# Patient Record
Sex: Female | Born: 1970
Health system: Southern US, Community
[De-identification: ages and names within clinical notes are randomized; demographics above are authoritative.]

## PROBLEM LIST (undated history)

## (undated) DIAGNOSIS — R161 Splenomegaly, not elsewhere classified: Secondary | ICD-10-CM

## (undated) DIAGNOSIS — D696 Thrombocytopenia, unspecified: Secondary | ICD-10-CM

## (undated) DIAGNOSIS — K766 Portal hypertension: Secondary | ICD-10-CM

## (undated) DIAGNOSIS — J45909 Unspecified asthma, uncomplicated: Secondary | ICD-10-CM

## (undated) DIAGNOSIS — K7460 Unspecified cirrhosis of liver: Secondary | ICD-10-CM

## (undated) DIAGNOSIS — F411 Generalized anxiety disorder: Secondary | ICD-10-CM

## (undated) DIAGNOSIS — E119 Type 2 diabetes mellitus without complications: Secondary | ICD-10-CM

## (undated) DIAGNOSIS — R0600 Dyspnea, unspecified: Secondary | ICD-10-CM

## (undated) DIAGNOSIS — K589 Irritable bowel syndrome without diarrhea: Secondary | ICD-10-CM

## (undated) DIAGNOSIS — I10 Essential (primary) hypertension: Secondary | ICD-10-CM

## (undated) DIAGNOSIS — E785 Hyperlipidemia, unspecified: Secondary | ICD-10-CM

## (undated) DIAGNOSIS — K2950 Unspecified chronic gastritis without bleeding: Secondary | ICD-10-CM

## (undated) DIAGNOSIS — K219 Gastro-esophageal reflux disease without esophagitis: Secondary | ICD-10-CM

## (undated) DIAGNOSIS — M1990 Unspecified osteoarthritis, unspecified site: Secondary | ICD-10-CM

## (undated) DIAGNOSIS — G4730 Sleep apnea, unspecified: Secondary | ICD-10-CM

## (undated) DIAGNOSIS — E039 Hypothyroidism, unspecified: Secondary | ICD-10-CM

## (undated) DIAGNOSIS — E669 Obesity, unspecified: Secondary | ICD-10-CM

## (undated) HISTORY — DX: Essential (primary) hypertension: I10

## (undated) HISTORY — DX: Unspecified chronic gastritis without bleeding: K29.50

## (undated) HISTORY — DX: Obesity, unspecified: E66.9

## (undated) HISTORY — DX: Type 2 diabetes mellitus without complications: E11.9

## (undated) HISTORY — PX: TOTAL VAGINAL HYSTERECTOMY: SHX2548

## (undated) HISTORY — PX: TONSILLECTOMY: SUR1361

## (undated) HISTORY — DX: Thrombocytopenia, unspecified: D69.6

## (undated) HISTORY — DX: Unspecified osteoarthritis, unspecified site: M19.90

## (undated) HISTORY — DX: Irritable bowel syndrome, unspecified: K58.9

## (undated) HISTORY — DX: Generalized anxiety disorder: F41.1

## (undated) HISTORY — DX: Splenomegaly, not elsewhere classified: R16.1

## (undated) HISTORY — DX: Hyperlipidemia, unspecified: E78.5

## (undated) HISTORY — DX: Unspecified cirrhosis of liver: K74.60

## (undated) HISTORY — DX: Portal hypertension: K76.6

---

## 2016-01-14 DIAGNOSIS — G4733 Obstructive sleep apnea (adult) (pediatric): Secondary | ICD-10-CM | POA: Diagnosis not present

## 2016-01-15 DIAGNOSIS — G4733 Obstructive sleep apnea (adult) (pediatric): Secondary | ICD-10-CM | POA: Diagnosis not present

## 2016-01-21 DIAGNOSIS — G4733 Obstructive sleep apnea (adult) (pediatric): Secondary | ICD-10-CM | POA: Diagnosis not present

## 2016-02-14 DIAGNOSIS — G4733 Obstructive sleep apnea (adult) (pediatric): Secondary | ICD-10-CM | POA: Diagnosis not present

## 2016-02-28 DIAGNOSIS — J0100 Acute maxillary sinusitis, unspecified: Secondary | ICD-10-CM | POA: Diagnosis not present

## 2017-01-11 DIAGNOSIS — G4733 Obstructive sleep apnea (adult) (pediatric): Secondary | ICD-10-CM | POA: Diagnosis not present

## 2017-01-21 ENCOUNTER — Encounter: Payer: Self-pay | Admitting: Internal Medicine

## 2017-01-21 ENCOUNTER — Ambulatory Visit (INDEPENDENT_AMBULATORY_CARE_PROVIDER_SITE_OTHER): Payer: BLUE CROSS/BLUE SHIELD | Admitting: Internal Medicine

## 2017-01-21 VITALS — BP 112/80 | HR 77 | Ht 63.78 in | Wt 177.0 lb

## 2017-01-21 DIAGNOSIS — Z794 Long term (current) use of insulin: Secondary | ICD-10-CM | POA: Diagnosis not present

## 2017-01-21 DIAGNOSIS — E1169 Type 2 diabetes mellitus with other specified complication: Secondary | ICD-10-CM

## 2017-01-21 DIAGNOSIS — K7460 Unspecified cirrhosis of liver: Secondary | ICD-10-CM

## 2017-01-21 DIAGNOSIS — E034 Atrophy of thyroid (acquired): Secondary | ICD-10-CM | POA: Diagnosis not present

## 2017-01-21 DIAGNOSIS — K581 Irritable bowel syndrome with constipation: Secondary | ICD-10-CM

## 2017-01-21 DIAGNOSIS — K766 Portal hypertension: Secondary | ICD-10-CM | POA: Insufficient documentation

## 2017-01-21 DIAGNOSIS — E119 Type 2 diabetes mellitus without complications: Secondary | ICD-10-CM | POA: Diagnosis not present

## 2017-01-21 DIAGNOSIS — E785 Hyperlipidemia, unspecified: Secondary | ICD-10-CM

## 2017-01-21 DIAGNOSIS — K2950 Unspecified chronic gastritis without bleeding: Secondary | ICD-10-CM | POA: Insufficient documentation

## 2017-01-21 DIAGNOSIS — F411 Generalized anxiety disorder: Secondary | ICD-10-CM | POA: Insufficient documentation

## 2017-01-21 DIAGNOSIS — G4733 Obstructive sleep apnea (adult) (pediatric): Secondary | ICD-10-CM | POA: Diagnosis not present

## 2017-01-21 DIAGNOSIS — R161 Splenomegaly, not elsewhere classified: Secondary | ICD-10-CM | POA: Insufficient documentation

## 2017-01-21 DIAGNOSIS — Z9989 Dependence on other enabling machines and devices: Secondary | ICD-10-CM

## 2017-01-21 DIAGNOSIS — I10 Essential (primary) hypertension: Secondary | ICD-10-CM

## 2017-01-21 DIAGNOSIS — K589 Irritable bowel syndrome without diarrhea: Secondary | ICD-10-CM | POA: Insufficient documentation

## 2017-01-21 DIAGNOSIS — J3089 Other allergic rhinitis: Secondary | ICD-10-CM | POA: Diagnosis not present

## 2017-01-21 DIAGNOSIS — D696 Thrombocytopenia, unspecified: Secondary | ICD-10-CM | POA: Insufficient documentation

## 2017-01-21 DIAGNOSIS — T7840XA Allergy, unspecified, initial encounter: Secondary | ICD-10-CM | POA: Insufficient documentation

## 2017-01-21 DIAGNOSIS — M1990 Unspecified osteoarthritis, unspecified site: Secondary | ICD-10-CM | POA: Insufficient documentation

## 2017-01-21 DIAGNOSIS — E039 Hypothyroidism, unspecified: Secondary | ICD-10-CM | POA: Insufficient documentation

## 2017-01-21 DIAGNOSIS — F419 Anxiety disorder, unspecified: Secondary | ICD-10-CM

## 2017-01-21 DIAGNOSIS — E669 Obesity, unspecified: Secondary | ICD-10-CM | POA: Insufficient documentation

## 2017-01-21 MED ORDER — AZITHROMYCIN 250 MG PO TABS: ORAL_TABLET | ORAL | 0 refills | Status: DC

## 2017-01-21 MED ORDER — SERTRALINE HCL 50 MG PO TABS: 75.0000 mg | ORAL_TABLET | Freq: Every day | ORAL | 2 refills | Status: DC

## 2017-01-21 NOTE — Progress Notes (Signed)
Date:  01/21/2017   Name:  Amber Odonnell   DOB:  Dec 10, 1970   MRN:  [id number]   Chief Complaint: [place] (Moved from [place] in January- lives in [place].); Irritable Bowel Syndrome (Not able to eat alot. Feeling more fatigue- extreme contipation. Was taking Linzess but stopped taking due to gassyness. Bloated and feels more at top of stomach where pain is.  ); and Allergies (Nose was bleeding inside of nose when left house. Feels congested, ears feel like have fliud in them, and runny nose. Took sudafed today- thats why nose bled.) Diabetes  She presents for her follow-up diabetic visit. She has type 2 diabetes mellitus. Her disease course has been stable. Hypoglycemia symptoms include nervousness/anxiousness. Pertinent negatives for hypoglycemia include no dizziness or headaches. Associated symptoms include fatigue. Pertinent negatives for diabetes include no chest pain, no polydipsia and no polyuria. Symptoms are stable. Current diabetic treatment includes intensive insulin program (plus metformin and jardiance but stopped jardiance recently without much change in BS). She is compliant with treatment most of the time. She monitors blood glucose at home 1-2 x per day. Her breakfast blood glucose is taken between 6-7 am. Her breakfast blood glucose range is generally 180-200 mg/dl.  Thyroid Problem  Presents for follow-up visit. Symptoms include anxiety, constipation, fatigue, palpitations and weight gain. Patient reports no diaphoresis. The symptoms have been stable. Her past medical history is significant for hyperlipidemia.  Hyperlipidemia  This is a chronic problem. Pertinent negatives include no chest pain or shortness of breath. Current antihyperlipidemic treatment includes statins. The current treatment provides moderate (about to change to crestor from zocor) improvement of lipids.  Anxiety  Presents for follow-up visit. Symptoms include nervous/anxious behavior and  palpitations. Patient reports no chest pain, decreased concentration, dizziness, panic or shortness of breath. The quality of sleep is fair.   Compliance with medications: started sertraline in December.  Hypertension  This is a chronic problem. The problem is controlled. Associated symptoms include anxiety and palpitations. Pertinent negatives include no chest pain, headaches or shortness of breath. Past treatments include beta blockers. There are no compliance problems.  Identifiable causes of hypertension include a thyroid problem.  IBS - had colonoscopy in [place] in 2016. She has early satiety with bloating and pain. Linzess helped the constipation but caused gas and flatulence. She stopped LInzess.  She has a small hard stool daily if she takes no medications. OSA - on nightly CPAP.  Not sure if its helping much but she continues to use it. Allergies - multiple allergies by recent testing.  Has tried flonase, singulair and zyrtec without improvement.  Injections were recommended but then she moved. Cirrhosis - pt states this was diagnosed in the past few years.  Characterized as early stage, thought to be due to DM.  She is not sure she every had fluid and has not been on diuretics.  She has been on Xifaxan but is currently out. Review of Systems  Constitutional: Positive for fatigue and weight gain. Negative for chills, diaphoresis, fever and unexpected weight change.  HENT: Positive for congestion, ear pain, sinus pain and sinus pressure. Negative for hearing loss.   Respiratory: Negative for chest tightness, shortness of breath and wheezing.   Cardiovascular: Positive for palpitations. Negative for chest pain and leg swelling.  Gastrointestinal: Positive for abdominal distention and constipation. Negative for abdominal pain and blood in stool.  Endocrine: Negative for polydipsia and polyuria.  Genitourinary: Negative for dysuria.  Musculoskeletal: Negative for arthralgias.  Skin: Negative for  rash.  Neurological: Negative for dizziness and headaches.  Psychiatric/Behavioral: Negative for decreased concentration, dysphoric mood and sleep disturbance. The patient is nervous/anxious.     Patient Active Problem List   Diagnosis Date Noted  . Environmental and seasonal allergies 01/21/2017  . Type 2 diabetes mellitus without complication, with long-term current use of insulin ([place]) 01/21/2017  . Hypothyroidism due to acquired atrophy of thyroid 01/21/2017  . Hyperlipidemia associated with type 2 diabetes mellitus ([place]) 01/21/2017  . Irritable bowel syndrome with constipation 01/21/2017  . Anxiety disorder 01/21/2017    Prior to Admission medications   Medication Sig Start Date End Date Taking? Authorizing Provider  B Complex Vitamins (B-COMPLEX/B-12 SL) Place under the tongue every morning.   Yes Historical Provider, MD  diclofenac (VOLTAREN) 75 MG EC tablet Take 75 mg by mouth 2 (two) times daily.   Yes Historical Provider, MD  empagliflozin (JARDIANCE) 10 MG TABS tablet Take 10 mg by mouth daily.   Yes Historical Provider, MD  fluticasone (FLONASE) 50 MCG/ACT nasal spray Place 2 sprays into both nostrils daily.   Yes Historical Provider, MD  Insulin Aspart Prot & Aspart (NOVOLOG MIX 70/30 FLEXPEN [place]) Inject 26 Units into the skin 3 (three) times daily.   Yes Historical Provider, MD  Insulin Degludec (TRESIBA FLEXTOUCH [place]) Inject 80 Units into the skin daily.   Yes Historical Provider, MD  levothyroxine (SYNTHROID, LEVOTHROID) 50 MCG tablet Take 50 mcg by mouth daily before breakfast.   Yes Historical Provider, MD  linaclotide (LINZESS) 290 MCG CAPS capsule Take 290 mcg by mouth daily before breakfast.   Yes Historical Provider, MD  Loratadine 10 MG CAPS Take by mouth daily.   Yes Historical Provider, MD  losartan (COZAAR) 25 MG tablet Take 25 mg by mouth 2 (two) times daily.   Yes Historical Provider, MD  MEGARED OMEGA-3 KRILL OIL PO Take by mouth.   Yes Historical Provider, MD    metFORMIN (GLUCOPHAGE-XR) 500 MG 24 hr tablet Take 1,000 mg by mouth 2 (two) times daily.   Yes Historical Provider, MD  montelukast (SINGULAIR) 10 MG tablet Take 10 mg by mouth at bedtime.   Yes Historical Provider, MD  Multiple Vitamins-Minerals (ALIVE ONCE DAILY WOMENS 50+ PO) Take by mouth.   Yes Historical Provider, MD  nadolol (CORGARD) 20 MG tablet Take 20 mg by mouth daily.   Yes Historical Provider, MD  pantoprazole (PROTONIX) 40 MG tablet Take 40 mg by mouth daily.   Yes Historical Provider, MD  rifaximin (XIFAXAN) 200 MG tablet Take 200 mg by mouth 2 (two) times daily.   Yes Historical Provider, MD  rosuvastatin (CRESTOR) 10 MG tablet Take 10 mg by mouth daily.   Yes Historical Provider, MD  sertraline (ZOLOFT) 50 MG tablet Take 50 mg by mouth daily.   Yes Historical Provider, MD    Allergies  Allergen Reactions  . Hydrocodone-Homatropine Other (See Comments)    Thrush mouth    History reviewed. No pertinent surgical history.  Social History  Substance Use Topics  . Smoking status: Never Smoker  . Smokeless tobacco: Never Used  . Alcohol use No     Medication list has been reviewed and updated.   Physical Exam  Constitutional: She is oriented to person, place, and time. She appears well-developed. No distress.  HENT:  Head: Normocephalic and atraumatic.  Right Ear: Ear canal normal. Tympanic membrane is retracted. Tympanic membrane is not erythematous.  Left Ear: Ear canal normal. Tympanic membrane is  retracted. Tympanic membrane is not erythematous.  Nose: Right sinus exhibits maxillary sinus tenderness. Left sinus exhibits maxillary sinus tenderness.  Mouth/Throat: No posterior oropharyngeal erythema.  Neck: Normal range of motion. Neck supple. Carotid bruit is not present.  Cardiovascular: Normal rate, regular rhythm and normal heart sounds.   Pulmonary/Chest: Effort normal and breath sounds normal. No respiratory distress. She has no wheezes. She has no  rhonchi.  Abdominal: Soft. Normal appearance and bowel sounds are normal. She exhibits no distension. There is tenderness in the left lower quadrant. There is no rigidity, no guarding and no CVA tenderness.  Neurological: She is alert and oriented to person, place, and time. She has normal strength. No sensory deficit.  Skin: Skin is warm and dry. No rash noted.  Psychiatric: She has a normal mood and affect. Her speech is normal and behavior is normal. Thought content normal.  Nursing note and vitals reviewed.   BP 112/80 (BP Location: Left Arm, Patient Position: Sitting, Cuff Size: Normal)   Pulse 77   Ht 5' 3.78" (1.62 m)   Wt 177 lb (80.3 kg)   SpO2 96%   BMI 30.59 kg/m   Assessment and Plan: 1. Environmental and seasonal allergies With mild sinusitis Stop flonase, singulair and claritin due to lack of benefit - azithromycin (ZITHROMAX Z-PAK) 250 MG tablet; UAD  Dispense: 6 each; Refill: 0 - Ambulatory referral to ENT  2. Type 2 diabetes mellitus without complication, with long-term current use of insulin (HCC) Continue Tresiba, Novolog and metformin Hold Jardiance for now - CBC with Differential/Platelet - Comprehensive metabolic panel - Hemoglobin A1c  3. Hypothyroidism due to acquired atrophy of thyroid supplemented - TSH  4. Hyperlipidemia associated with type 2 diabetes mellitus ([place]) On zocor now - transition to crestor  5. Irritable bowel syndrome with constipation Remain off Linzess; consider Amitiza but will wait for GI recommendations - Ambulatory referral to Gastroenterology  6. Anxiety disorder, unspecified type Increase dose of Zoloft  7. OSA on CPAP Continue CPAP  8. Hepatic cirrhosis, unspecified hepatic cirrhosis type, unspecified whether ascites present ([place]) Remain off Xifaxan due to unclear benefit - Ambulatory referral to Gastroenterology  9. Essential hypertension Controlled but should be on ARB or ACE   Meds ordered this encounter   Medications  . azithromycin (ZITHROMAX Z-PAK) 250 MG tablet    Sig: UAD    Dispense:  6 each    Refill:  0  . sertraline (ZOLOFT) 50 MG tablet    Sig: Take 1.5 tablets (75 mg total) by mouth daily. 1.5 daily    Dispense:  45 tablet    Refill:  2   I spent 45 minutes with patient on this encounter.  More than 50% of time was spent in face to face education, medication management, counseling and care coordination.  Halina Maidens, MD [facility]  01/21/2017

## 2017-01-22 ENCOUNTER — Other Ambulatory Visit: Payer: Self-pay | Admitting: Internal Medicine

## 2017-01-22 LAB — CBC WITH DIFFERENTIAL/PLATELET
BASOS ABS: 0 10*3/uL (ref 0.0–0.2)
Basos: 1 %
EOS (ABSOLUTE): 0.2 10*3/uL (ref 0.0–0.4)
Eos: 3 %
Hematocrit: 38.7 % (ref 34.0–46.6)
Hemoglobin: 11.6 g/dL (ref 11.1–15.9)
IMMATURE GRANS (ABS): 0 10*3/uL (ref 0.0–0.1)
IMMATURE GRANULOCYTES: 0 %
Lymphocytes Absolute: 1.2 10*3/uL (ref 0.7–3.1)
Lymphs: 22 %
MCH: 26.7 pg (ref 26.6–33.0)
MCHC: 30 g/dL — ABNORMAL LOW (ref 31.5–35.7)
MCV: 89 fL (ref 79–97)
MONOCYTES: 9 %
Monocytes Absolute: 0.5 10*3/uL (ref 0.1–0.9)
Neutrophils Absolute: 3.5 10*3/uL (ref 1.4–7.0)
Neutrophils: 65 %
PLATELETS: 110 10*3/uL — AB (ref 150–379)
RBC: 4.35 x10E6/uL (ref 3.77–5.28)
RDW: 17.4 % — ABNORMAL HIGH (ref 12.3–15.4)
WBC: 5.4 10*3/uL (ref 3.4–10.8)

## 2017-01-22 LAB — COMPREHENSIVE METABOLIC PANEL
ALT: 55 IU/L — AB (ref 0–32)
AST: 78 IU/L — ABNORMAL HIGH (ref 0–40)
Albumin/Globulin Ratio: 1.4 (ref 1.2–2.2)
Albumin: 4.1 g/dL (ref 3.5–5.5)
Alkaline Phosphatase: 149 IU/L — ABNORMAL HIGH (ref 39–117)
BILIRUBIN TOTAL: 0.5 mg/dL (ref 0.0–1.2)
BUN/Creatinine Ratio: 10 (ref 9–23)
BUN: 7 mg/dL (ref 6–24)
CALCIUM: 9.2 mg/dL (ref 8.7–10.2)
CHLORIDE: 99 mmol/L (ref 96–106)
CO2: 21 mmol/L (ref 18–29)
Creatinine, Ser: 0.7 mg/dL (ref 0.57–1.00)
GFR, EST AFRICAN AMERICAN: 119 mL/min/{1.73_m2} (ref >59)
GFR, EST NON AFRICAN AMERICAN: 104 mL/min/{1.73_m2} (ref >59)
Globulin, Total: 2.9 g/dL (ref 1.5–4.5)
Glucose: 351 mg/dL — ABNORMAL HIGH (ref 65–99)
Potassium: 4.1 mmol/L (ref 3.5–5.2)
Sodium: 138 mmol/L (ref 134–144)
TOTAL PROTEIN: 7 g/dL (ref 6.0–8.5)

## 2017-01-22 LAB — HEMOGLOBIN A1C
Est. average glucose Bld gHb Est-mCnc: 235 mg/dL
Hgb A1c MFr Bld: 9.8 % — ABNORMAL HIGH (ref 4.8–5.6)

## 2017-01-22 LAB — TSH: TSH: 1.05 u[IU]/mL (ref 0.450–4.500)

## 2017-01-22 MED ORDER — EMPAGLIFLOZIN 25 MG PO TABS: 25.0000 mg | ORAL_TABLET | Freq: Every day | ORAL | 5 refills | Status: DC

## 2017-02-10 DIAGNOSIS — G4733 Obstructive sleep apnea (adult) (pediatric): Secondary | ICD-10-CM | POA: Diagnosis not present

## 2017-02-10 DIAGNOSIS — J301 Allergic rhinitis due to pollen: Secondary | ICD-10-CM | POA: Diagnosis not present

## 2017-02-16 ENCOUNTER — Encounter: Payer: Self-pay | Admitting: Gastroenterology

## 2017-02-16 ENCOUNTER — Other Ambulatory Visit: Payer: Self-pay

## 2017-02-16 ENCOUNTER — Ambulatory Visit (INDEPENDENT_AMBULATORY_CARE_PROVIDER_SITE_OTHER): Payer: BLUE CROSS/BLUE SHIELD | Admitting: Gastroenterology

## 2017-02-16 VITALS — BP 120/69 | HR 59 | Temp 99.3°F | Ht 63.0 in | Wt 179.0 lb

## 2017-02-16 DIAGNOSIS — K7460 Unspecified cirrhosis of liver: Secondary | ICD-10-CM | POA: Diagnosis not present

## 2017-02-16 DIAGNOSIS — K581 Irritable bowel syndrome with constipation: Secondary | ICD-10-CM

## 2017-02-16 MED ORDER — LACTULOSE 10 GM/15ML PO SOLN: 10.0000 g | Freq: Two times a day (BID) | ORAL | Status: DC

## 2017-02-16 NOTE — Progress Notes (Addendum)
Gastroenterology Consultation  Referring Provider:     Glean Hess, MD Primary Care Physician:  Glean Hess, MD Primary Gastroenterologist:  Dr. Allen Norris     Reason for Consultation:     Cirrhosis with irritable bowel syndrome        HPI:   Amber Odonnell is a 46 y.o. y/o female referred for consultation & management of Cirrhosis with irritable bowel syndrome by Dr. Army Melia, Jesse Sans, MD.  This patient comes in today with a history of cirrhosis after moving from [place]. The patient had an upper endoscopy there which showed her to have esophageal varices. The patient reports that she was also started on rifaximin at that time but was then taken off of it. The patient reports that she has been feeling weak and dizzy with increasing falls. There is no report of any black stools or bloody stools. The patient did not have any esophageal banding of her esophageal varices and was started on Nano all. The patient reports that she was tried on Linzess for her constipation but it made her gassy and she was unable to take it at work. The patient denies ever being started on lactulose. She reports that she has irritable bowel syndrome with constipation predominance and ports that she passes a lot of gas. The patient also thinks that she passes gas through her vagina.  Past Medical History:  Diagnosis Date  . Arthritis    of knee  . Chronic gastritis   . Cirrhosis of liver without ascites ([place])   . Diabetes mellitus without complication ([place])    type 2  . Generalized anxiety disorder   . Hyperlipidemia   . Hypertension   . Irritable bowel syndrome (IBS)    w/ constipation  . Obesity (BMI 30-39.9)   . Portal hypertension ([place])   . Splenomegaly   . Thrombocytopenia ([place])     Past Surgical History:  Procedure Laterality Date  . TONSILLECTOMY    . TOTAL VAGINAL HYSTERECTOMY     endometriosis    Prior to Admission medications   Medication Sig Start Date End Date Taking? Authorizing  Provider  BD PEN NEEDLE NANO U/F 32G X 4 MM MISC Inject 1 each into the skin 4 (four) times daily. 01/09/17  Yes [provider]  diclofenac (VOLTAREN) 75 MG EC tablet Take 75 mg by mouth 2 (two) times daily.   Yes [provider]  empagliflozin (JARDIANCE) 25 MG TABS tablet Take 25 mg by mouth daily. 01/22/17  Yes Glean Hess, MD  insulin aspart (NOVOLOG FLEXPEN) 100 UNIT/ML FlexPen Inject 26 Units into the skin 3 (three) times daily with meals.   Yes [provider]  Insulin Degludec (TRESIBA FLEXTOUCH [place]) Inject 80 Units into the skin daily.   Yes [provider]  levothyroxine (SYNTHROID, LEVOTHROID) 50 MCG tablet Take 50 mcg by mouth daily before breakfast.   Yes [provider]  losartan (COZAAR) 25 MG tablet Take 25 mg by mouth 2 (two) times daily.   Yes [provider]  MEGARED OMEGA-3 KRILL OIL PO Take by mouth.   Yes [provider]  metFORMIN (GLUCOPHAGE-XR) 500 MG 24 hr tablet Take 1,000 mg by mouth 2 (two) times daily.   Yes [provider]  Multiple Vitamins-Minerals (ALIVE ONCE DAILY WOMENS 50+ PO) Take by mouth.   Yes [provider]  nadolol (CORGARD) 20 MG tablet Take 20 mg by mouth daily.   Yes [provider]  pantoprazole (PROTONIX)  40 MG tablet Take 40 mg by mouth daily.   Yes [provider]  rifaximin (XIFAXAN) 200 MG tablet Take 200 mg by mouth 2 (two) times daily.   Yes [provider]  rosuvastatin (CRESTOR) 10 MG tablet Take 10 mg by mouth daily.   Yes [provider]  sertraline (ZOLOFT) 50 MG tablet Take 1.5 tablets (75 mg total) by mouth daily. 1.5 daily 01/21/17  Yes Glean Hess, MD  azithromycin [facility] Z-PAK) 250 MG tablet UAD Patient not taking: Reported on 02/16/2017 01/21/17   Glean Hess, MD  B Complex Vitamins (B-COMPLEX/B-12 SL) Place under the tongue every morning.    [provider]    Family History  Problem  Relation Age of Onset  . Breast cancer Mother   . Diabetes Mother   . Diabetes Father   . Hypertension Father   . Cancer Paternal Grandmother      Social History  Substance Use Topics  . Smoking status: Never Smoker  . Smokeless tobacco: Never Used  . Alcohol use No    Allergies as of 02/16/2017 - Review Complete 01/21/2017  Allergen Reaction Noted  . Hydrocodone Other (See Comments) 01/21/2017    Review of Systems:    All systems reviewed and negative except where noted in HPI.   Physical Exam:  BP 120/69   Pulse (!) 59   Temp 99.3 F (37.4 C) (Oral)   Ht 5\' 3"  (1.6 m)   Wt 179 lb (81.2 kg)   BMI 31.71 kg/m  No LMP recorded. Patient has had a hysterectomy. Psych:  Alert and cooperative. Normal mood and affect. General:   Alert,  Well-developed, well-nourished, pleasant and cooperative in NAD Head:  Normocephalic and atraumatic. Eyes:  Sclera clear, no icterus.   Conjunctiva pink. Ears:  Normal auditory acuity. Nose:  No deformity, discharge, or lesions. Mouth:  No deformity or lesions,oropharynx pink & moist. Neck:  Supple; no masses or thyromegaly. Lungs:  Respirations even and unlabored.  Clear throughout to auscultation.   No wheezes, crackles, or rhonchi. No acute distress. Heart:  Regular rate and rhythm; no murmurs, clicks, rubs, or gallops. Abdomen:  Normal bowel sounds.  No bruits.  Soft, non-tender and non-distended without masses, hepatosplenomegaly or hernias noted.  No guarding or rebound tenderness.  Negative Carnett sign.   Rectal:  Deferred.  Msk:  Symmetrical without gross deformities.  Good, equal movement & strength bilaterally. Pulses:  Normal pulses noted. Extremities:  No clubbing or edema.  No cyanosis. Neurologic:  Alert and oriented x3;  grossly normal neurologically. Skin:  Intact without significant lesions or rashes.  No jaundice. Lymph Nodes:  No significant cervical adenopathy. Psych:  Alert and cooperative. Normal mood and  affect.  Imaging Studies: No results found.  Assessment and Plan:   Amber Odonnell is a 46 y.o. y/o female comes in with a history of cirrhosis with a cause being fatty liver. The patient had a workup in [place] with the workup being negative except for the results of the alpha-1 antitrypsin which I do not have. The patient will have her blood sent off for alpha-1 antitrypsin and also for alpha-fetoprotein. She will have her liver enzymes rechecked and she will be set up for right upper quadrant ultrasound. The patient has also been told that she will need an upper endoscopy for eradication of her esophageal varices and she will be started on lactulose for her constipation which also may help with possible hepatic encephalopathy as the cause  of her confusion and frequent falls. The patient has been told to see Gynecology due to the passing of air from her vagina. The patient has been explained the plan and agrees with it.    [name], MD. [facility]   Note: This dictation was prepared with Dragon dictation along with smaller phrase technology. Any transcriptional errors that result from this process are unintentional.

## 2017-02-17 ENCOUNTER — Telehealth: Payer: Self-pay | Admitting: Gastroenterology

## 2017-02-17 ENCOUNTER — Telehealth: Payer: Self-pay

## 2017-02-17 ENCOUNTER — Other Ambulatory Visit: Payer: Self-pay

## 2017-02-17 DIAGNOSIS — K581 Irritable bowel syndrome with constipation: Secondary | ICD-10-CM

## 2017-02-17 DIAGNOSIS — I8500 Esophageal varices without bleeding: Secondary | ICD-10-CM

## 2017-02-17 LAB — HEPATIC FUNCTION PANEL
ALBUMIN: 4.1 g/dL (ref 3.5–5.5)
ALK PHOS: 147 IU/L — AB (ref 39–117)
ALT: 43 IU/L — ABNORMAL HIGH (ref 0–32)
AST: 50 IU/L — ABNORMAL HIGH (ref 0–40)
BILIRUBIN, DIRECT: 0.18 mg/dL (ref 0.00–0.40)
Bilirubin Total: 0.4 mg/dL (ref 0.0–1.2)
TOTAL PROTEIN: 6.8 g/dL (ref 6.0–8.5)

## 2017-02-17 LAB — AFP TUMOR MARKER: AFP-Tumor Marker: 4.5 ng/mL (ref 0.0–8.3)

## 2017-02-17 LAB — ALPHA-1-ANTITRYPSIN: A-1 Antitrypsin: 159 mg/dL (ref 90–200)

## 2017-02-17 NOTE — Telephone Encounter (Signed)
Pt has been informed ultrasound of abdomen is scheduled for [facility] 7:45 am Monday May 21st. Nothing to eat or drink after midnight on Sunday May 20th.

## 2017-02-17 NOTE — Telephone Encounter (Signed)
Pt has been informed her EGD has been scheduled for May 24th (Thursday) with Dr. Allen Norris at [facility].

## 2017-02-17 NOTE — Telephone Encounter (Signed)
02/17/17 Spoke w/ Lorriane Shire at [facility] and NO prior Josem Kaufmann is required for EGD w/ Banding [id number] / I85.00

## 2017-02-18 ENCOUNTER — Telehealth: Payer: Self-pay | Admitting: Gastroenterology

## 2017-02-18 ENCOUNTER — Other Ambulatory Visit: Payer: Self-pay

## 2017-02-18 MED ORDER — LACTULOSE 10 GM/15ML PO SOLN: 10.0000 g | Freq: Two times a day (BID) | ORAL | 0 refills | Status: DC | PRN

## 2017-02-18 NOTE — Telephone Encounter (Signed)
LVM  letting pt know rx has been sent to pharmacy for lactulose.

## 2017-02-18 NOTE — Telephone Encounter (Signed)
Dr. Allen Norris was going to call in a rX, for EGD,  for patient to [facility] on [address]. Patient stated [facility] never got it. She doesn't know the name of it.

## 2017-02-23 ENCOUNTER — Ambulatory Visit
Admission: RE | Admit: 2017-02-23 | Discharge: 2017-02-23 | Disposition: A | Discharge status: Home / Self Care | Payer: BLUE CROSS/BLUE SHIELD | Source: Ambulatory Visit | Attending: Gastroenterology | Admitting: Gastroenterology

## 2017-02-23 DIAGNOSIS — K7460 Unspecified cirrhosis of liver: Secondary | ICD-10-CM | POA: Diagnosis not present

## 2017-02-23 DIAGNOSIS — K581 Irritable bowel syndrome with constipation: Secondary | ICD-10-CM

## 2017-02-25 NOTE — Discharge Instructions (Signed)

## 2017-02-26 ENCOUNTER — Ambulatory Visit: Payer: BLUE CROSS/BLUE SHIELD | Admitting: Anesthesiology

## 2017-02-26 ENCOUNTER — Encounter
Admission: RE | Disposition: A | Discharge status: Home / Self Care | Payer: Self-pay | Source: Ambulatory Visit | Attending: Gastroenterology

## 2017-02-26 ENCOUNTER — Telehealth: Payer: Self-pay

## 2017-02-26 ENCOUNTER — Ambulatory Visit
Admission: RE | Admit: 2017-02-26 | Discharge: 2017-02-26 | Disposition: A | Discharge status: Home / Self Care | Payer: BLUE CROSS/BLUE SHIELD | Source: Ambulatory Visit | Attending: Gastroenterology | Admitting: Gastroenterology

## 2017-02-26 DIAGNOSIS — Z6830 Body mass index (BMI) 30.0-30.9, adult: Secondary | ICD-10-CM | POA: Insufficient documentation

## 2017-02-26 DIAGNOSIS — Z794 Long term (current) use of insulin: Secondary | ICD-10-CM | POA: Insufficient documentation

## 2017-02-26 DIAGNOSIS — F411 Generalized anxiety disorder: Secondary | ICD-10-CM | POA: Diagnosis not present

## 2017-02-26 DIAGNOSIS — K635 Polyp of colon: Secondary | ICD-10-CM

## 2017-02-26 DIAGNOSIS — Z885 Allergy status to narcotic agent status: Secondary | ICD-10-CM | POA: Diagnosis not present

## 2017-02-26 DIAGNOSIS — Z79899 Other long term (current) drug therapy: Secondary | ICD-10-CM | POA: Diagnosis not present

## 2017-02-26 DIAGNOSIS — K580 Irritable bowel syndrome with diarrhea: Secondary | ICD-10-CM | POA: Diagnosis not present

## 2017-02-26 DIAGNOSIS — K2970 Gastritis, unspecified, without bleeding: Secondary | ICD-10-CM | POA: Diagnosis not present

## 2017-02-26 DIAGNOSIS — K7460 Unspecified cirrhosis of liver: Secondary | ICD-10-CM | POA: Diagnosis not present

## 2017-02-26 DIAGNOSIS — K2950 Unspecified chronic gastritis without bleeding: Secondary | ICD-10-CM | POA: Diagnosis not present

## 2017-02-26 DIAGNOSIS — E669 Obesity, unspecified: Secondary | ICD-10-CM | POA: Insufficient documentation

## 2017-02-26 DIAGNOSIS — E119 Type 2 diabetes mellitus without complications: Secondary | ICD-10-CM | POA: Diagnosis not present

## 2017-02-26 DIAGNOSIS — K219 Gastro-esophageal reflux disease without esophagitis: Secondary | ICD-10-CM | POA: Diagnosis not present

## 2017-02-26 DIAGNOSIS — E039 Hypothyroidism, unspecified: Secondary | ICD-10-CM | POA: Diagnosis not present

## 2017-02-26 DIAGNOSIS — I8510 Secondary esophageal varices without bleeding: Secondary | ICD-10-CM | POA: Insufficient documentation

## 2017-02-26 DIAGNOSIS — E785 Hyperlipidemia, unspecified: Secondary | ICD-10-CM | POA: Diagnosis not present

## 2017-02-26 DIAGNOSIS — I10 Essential (primary) hypertension: Secondary | ICD-10-CM | POA: Diagnosis not present

## 2017-02-26 DIAGNOSIS — K766 Portal hypertension: Secondary | ICD-10-CM | POA: Diagnosis not present

## 2017-02-26 DIAGNOSIS — R195 Other fecal abnormalities: Secondary | ICD-10-CM

## 2017-02-26 DIAGNOSIS — K2960 Other gastritis without bleeding: Secondary | ICD-10-CM | POA: Diagnosis not present

## 2017-02-26 DIAGNOSIS — Z791 Long term (current) use of non-steroidal anti-inflammatories (NSAID): Secondary | ICD-10-CM | POA: Diagnosis not present

## 2017-02-26 DIAGNOSIS — D125 Benign neoplasm of sigmoid colon: Secondary | ICD-10-CM

## 2017-02-26 HISTORY — DX: Sleep apnea, unspecified: G47.30

## 2017-02-26 HISTORY — DX: Gastro-esophageal reflux disease without esophagitis: K21.9

## 2017-02-26 HISTORY — DX: Unspecified asthma, uncomplicated: J45.909

## 2017-02-26 HISTORY — DX: Dyspnea, unspecified: R06.00

## 2017-02-26 HISTORY — PX: ESOPHAGOGASTRODUODENOSCOPY (EGD) WITH PROPOFOL: SHX5813

## 2017-02-26 HISTORY — DX: Hypothyroidism, unspecified: E03.9

## 2017-02-26 LAB — GLUCOSE, CAPILLARY
GLUCOSE-CAPILLARY: 137 mg/dL — AB (ref 65–99)
Glucose-Capillary: 167 mg/dL — ABNORMAL HIGH (ref 65–99)

## 2017-02-26 SURGERY — ESOPHAGOGASTRODUODENOSCOPY (EGD) WITH PROPOFOL
Anesthesia: Monitor Anesthesia Care | Wound class: Clean Contaminated

## 2017-02-26 MED ORDER — LACTATED RINGERS IV SOLN
10.0000 mL/h | INTRAVENOUS | Status: DC
  Administered 2017-02-26: 10 mL/h via INTRAVENOUS

## 2017-02-26 MED ORDER — LIDOCAINE HCL (CARDIAC) 20 MG/ML IV SOLN
INTRAVENOUS | Status: DC | PRN
  Administered 2017-02-26: 50 mg via INTRAVENOUS

## 2017-02-26 MED ORDER — GLYCOPYRROLATE 0.2 MG/ML IJ SOLN
INTRAMUSCULAR | Status: DC | PRN
  Administered 2017-02-26: 0.2 mg via INTRAVENOUS

## 2017-02-26 MED ORDER — FENTANYL CITRATE (PF) 100 MCG/2ML IJ SOLN
50.0000 ug | Freq: Once | INTRAMUSCULAR | Status: AC
  Administered 2017-02-26: 50 ug via INTRAVENOUS

## 2017-02-26 MED ORDER — OXYCODONE HCL 5 MG/5ML PO SOLN
5.0000 mg | Freq: Once | ORAL | Status: AC
  Administered 2017-02-26: 5 mg via ORAL

## 2017-02-26 MED ORDER — PROPOFOL 10 MG/ML IV BOLUS
INTRAVENOUS | Status: DC | PRN
  Administered 2017-02-26 (×3): 50 mg via INTRAVENOUS
  Administered 2017-02-26: 100 mg via INTRAVENOUS
  Administered 2017-02-26: 50 mg via INTRAVENOUS

## 2017-02-26 MED ORDER — SIMETHICONE 40 MG/0.6ML PO SUSP
ORAL | Status: DC | PRN
  Administered 2017-02-26: 12:00:00

## 2017-02-26 SURGICAL SUPPLY — 33 items
BALLN DILATOR 10-12 8 (BALLOONS)
BALLN DILATOR 12-15 8 (BALLOONS)
BALLN DILATOR 15-18 8 (BALLOONS)
BALLN DILATOR CRE 0-12 8 (BALLOONS)
BALLN DILATOR ESOPH 8 10 CRE (MISCELLANEOUS) IMPLANT
BALLOON DILATOR 12-15 8 (BALLOONS) IMPLANT
BALLOON DILATOR 15-18 8 (BALLOONS) IMPLANT
BALLOON DILATOR CRE 0-12 8 (BALLOONS) IMPLANT
BLOCK BITE 60FR ADLT L/F GRN (MISCELLANEOUS) ×2 IMPLANT
CANISTER SUCT 1200ML W/VALVE (MISCELLANEOUS) ×2 IMPLANT
CLIP HMST 235XBRD CATH ROT (MISCELLANEOUS) IMPLANT
CLIP RESOLUTION 360 11X235 (MISCELLANEOUS)
FCP ESCP3.2XJMB 240X2.8X (MISCELLANEOUS)
FORCEPS BIOP RAD 4 LRG CAP 4 (CUTTING FORCEPS) IMPLANT
FORCEPS BIOP RJ4 240 W/NDL (MISCELLANEOUS)
FORCEPS ESCP3.2XJMB 240X2.8X (MISCELLANEOUS) IMPLANT
GOWN CVR UNV OPN BCK APRN NK (MISCELLANEOUS) ×2 IMPLANT
GOWN ISOL THUMB LOOP REG UNIV (MISCELLANEOUS) ×2
INJECTOR VARIJECT VIN23 (MISCELLANEOUS) IMPLANT
KIT DEFENDO VALVE AND CONN (KITS) IMPLANT
KIT ENDO PROCEDURE OLY (KITS) ×2 IMPLANT
LIGATOR MULTIBAND 6SHOOTER MBL (MISCELLANEOUS) ×2 IMPLANT
MARKER SPOT ENDO TATTOO 5ML (MISCELLANEOUS) IMPLANT
PAD GROUND ADULT SPLIT (MISCELLANEOUS) IMPLANT
RETRIEVER NET PLAT FOOD (MISCELLANEOUS) IMPLANT
SNARE SHORT THROW 13M SML OVAL (MISCELLANEOUS) IMPLANT
SNARE SHORT THROW 30M LRG OVAL (MISCELLANEOUS) IMPLANT
SPOT EX ENDOSCOPIC TATTOO (MISCELLANEOUS)
SYR INFLATION 60ML (SYRINGE) IMPLANT
TRAP ETRAP POLY (MISCELLANEOUS) IMPLANT
VARIJECT INJECTOR VIN23 (MISCELLANEOUS)
WATER STERILE IRR 250ML POUR (IV SOLUTION) ×2 IMPLANT
WIRE CRE 18-20MM 8CM F G (MISCELLANEOUS) IMPLANT

## 2017-02-26 NOTE — Anesthesia Preprocedure Evaluation (Addendum)
Anesthesia Evaluation  Patient identified by MRN, date of birth, ID band Patient awake    Reviewed: Allergy & Precautions, H&P   Airway Mallampati: II  TM Distance: >3 FB     Dental  (+) Teeth Intact   Pulmonary asthma , sleep apnea ,    breath sounds clear to auscultation       Cardiovascular hypertension,  Rhythm:regular Rate:Normal  hyperlipidemia   Neuro/Psych PSYCHIATRIC DISORDERS (generalized anxiety disorder)    GI/Hepatic GERD  Controlled,(+) Cirrhosis  (nonalcoholic)  Esophageal Varices    , IBS Chronic gastritis Portal hypertension    Endo/Other  diabetes, Type 2Hypothyroidism BMI 32   Renal/GU      Musculoskeletal   Abdominal   Peds  Hematology  (+) Blood dyscrasia (thrombocytopenia - platelets 110k), ,   Anesthesia Other Findings   Reproductive/Obstetrics                            Anesthesia Physical Anesthesia Plan  ASA: III  Anesthesia Plan: MAC   Post-op Pain Management:    Induction:   Airway Management Planned:   Additional Equipment:   Intra-op Plan:   Post-operative Plan:   Informed Consent: I have reviewed the patients History and Physical, chart, labs and discussed the procedure including the risks, benefits and alternatives for the proposed anesthesia with the patient or authorized representative who has indicated his/her understanding and acceptance.   Dental Advisory Given  Plan Discussed with: CRNA  Anesthesia Plan Comments:         Anesthesia Quick Evaluation

## 2017-02-26 NOTE — Op Note (Signed)
[facility] Gastroenterology Patient Name: Amber Odonnell Procedure Date: 02/26/2017 11:14 AM MRN: [id number] Account #: [id number] Date of Birth: 20-Apr-1971 Admit Type: Outpatient Age: 46 Room: [facility] OR ROOM 01 Gender: Female Note Status: Finalized Procedure:            Upper GI endoscopy Indications:          Cirrhosis rule out esophageal varices Providers:            [name] MD, MD Referring MD:         Halina Maidens, MD (Referring MD) Medicines:            Propofol per Anesthesia Complications:        No immediate complications. Procedure:            Pre-Anesthesia Assessment:                       - Prior to the procedure, a History and Physical was                        performed, and patient medications and allergies were                        reviewed. The patient's tolerance of previous                        anesthesia was also reviewed. The risks and benefits of                        the procedure and the sedation options and risks were                        discussed with the patient. All questions were                        answered, and informed consent was obtained. Prior                        Anticoagulants: The patient has taken no previous                        anticoagulant or antiplatelet agents. ASA Grade                        Assessment: II - A patient with mild systemic disease.                        After reviewing the risks and benefits, the patient was                        deemed in satisfactory condition to undergo the                        procedure.                       After obtaining informed consent, the endoscope was                        passed under direct vision. Throughout the procedure,  the patient's blood pressure, pulse, and oxygen                        saturations were monitored continuously. The Olympus                        190 Endoscope [id number]) was introduced through the                       mouth, and advanced to the second part of duodenum. The                        upper GI endoscopy was accomplished without difficulty.                        The patient tolerated the procedure well. Findings:      Grade III varices were found in the lower third of the esophagus. Four       bands were successfully placed with incomplete eradication of varices. A       slow ooze remained at the end of the procedure.      Localized moderate inflammation characterized by erythema was found in       the gastric antrum.      The examined duodenum was normal. Impression:           - Grade III esophageal varices. Incompletely                        eradicated. Banded.                       - Gastritis.                       - Normal examined duodenum.                       - No specimens collected. Recommendation:       - Discharge patient to home.                       - Full liquid diet for 2 days.                       - Continue present medications.                       - Repeat upper endoscopy in 6 weeks for retreatment. Procedure Code(s):    --- Professional ---                       [phone], Esophagogastroduodenoscopy, flexible, transoral;                        with band ligation of esophageal/gastric varices Diagnosis Code(s):    --- Professional ---                       K74.60, Unspecified cirrhosis of liver                       K29.70, Gastritis, unspecified, without bleeding  I85.10, Secondary esophageal varices without bleeding CPT copyright 2016 American Medical Association. All rights reserved. The codes documented in this report are preliminary and upon coder review may  be revised to meet current compliance requirements. [name] MD, MD 02/26/2017 11:33:34 AM This report has been signed electronically. Number of Addenda: 0 Note Initiated On: 02/26/2017 11:14 AM      [facility]

## 2017-02-26 NOTE — Telephone Encounter (Signed)
Lab results were discussed with pt today by Dr. Allen Norris at procedure appt.

## 2017-02-26 NOTE — Telephone Encounter (Signed)
-----   Message from [name], MD sent at 02/17/2017  1:54 PM EDT ----- Let the patient know that her liver enzymes are still elevated but her blood test for liver cancer was negative.

## 2017-02-26 NOTE — Anesthesia Procedure Notes (Signed)
Procedure Name: MAC Performed by: Raad Clayson Pre-anesthesia Checklist: Patient identified, Emergency Drugs available, Suction available, Timeout performed and Patient being monitored Patient Re-evaluated:Patient Re-evaluated prior to inductionOxygen Delivery Method: Nasal cannula Placement Confirmation: positive ETCO2     

## 2017-02-26 NOTE — Telephone Encounter (Signed)
-----   Message from [name], MD sent at 02/23/2017  6:24 PM EDT ----- Let the patient know the U/S did not show any masses.

## 2017-02-26 NOTE — Telephone Encounter (Signed)
Pt notified of US results.  

## 2017-02-26 NOTE — Anesthesia Postprocedure Evaluation (Signed)
Anesthesia Post Note  Patient: Amber Odonnell  Procedure(s) Performed: Procedure(s) (LRB): ESOPHAGOGASTRODUODENOSCOPY (EGD) WITH Banding (N/A)  Patient location during evaluation: PACU Anesthesia Type: MAC Level of consciousness: awake and alert Pain management: pain level controlled Vital Signs Assessment: post-procedure vital signs reviewed and stable Respiratory status: spontaneous breathing, nonlabored ventilation and respiratory function stable Cardiovascular status: stable Postop Assessment: no signs of nausea or vomiting Anesthetic complications: no    Veda Canning, MD

## 2017-02-26 NOTE — H&P (Signed)
Amber Lame, MD [facility] [address] [place], [place] [address] [phone] Fax : [phone]  Primary Care Physician:  Glean Hess, MD Primary Gastroenterologist:  Dr. Allen Norris  Pre-Procedure History & Physical: HPI:  Amber Odonnell is a 46 y.o. female is here for an endoscopy.   Past Medical History:  Diagnosis Date  . Arthritis    of knee  . Asthma   . Chronic gastritis   . Cirrhosis of liver without ascites ([place])   . Diabetes mellitus without complication ([place])    type 2  . Dyspnea   . Generalized anxiety disorder   . GERD (gastroesophageal reflux disease)   . Hyperlipidemia   . Hypertension   . Hypothyroidism   . Irritable bowel syndrome (IBS)    w/ constipation  . Obesity (BMI 30-39.9)   . Portal hypertension ([place])   . Sleep apnea   . Splenomegaly   . Thrombocytopenia ([place])     Past Surgical History:  Procedure Laterality Date  . TONSILLECTOMY    . TOTAL VAGINAL HYSTERECTOMY     endometriosis    Prior to Admission medications   Medication Sig Start Date End Date Taking? Authorizing Provider  B Complex Vitamins (B-COMPLEX/B-12 SL) Place under the tongue every morning.   Yes [provider]  BD PEN NEEDLE NANO U/F 32G X 4 MM MISC Inject 1 each into the skin 4 (four) times daily. 01/09/17  Yes [provider]  diclofenac (VOLTAREN) 75 MG EC tablet Take 75 mg by mouth 2 (two) times daily.   Yes [provider]  empagliflozin (JARDIANCE) 25 MG TABS tablet Take 25 mg by mouth daily. 01/22/17  Yes Glean Hess, MD  insulin aspart (NOVOLOG FLEXPEN) 100 UNIT/ML FlexPen Inject 26 Units into the skin 3 (three) times daily with meals.   Yes [provider]  lactulose (CHRONULAC) 10 GM/15ML solution Take 15 mLs (10 g total) by mouth 2 (two) times daily as needed for mild constipation. 02/18/17  Yes Amber Lame, MD  levothyroxine (SYNTHROID, LEVOTHROID) 50 MCG tablet Take 50 mcg by mouth daily before breakfast.   Yes  [provider]  losartan (COZAAR) 25 MG tablet Take 25 mg by mouth 2 (two) times daily.   Yes [provider]  MEGARED OMEGA-3 KRILL OIL PO Take by mouth.   Yes [provider]  metFORMIN (GLUCOPHAGE-XR) 500 MG 24 hr tablet Take 1,000 mg by mouth 2 (two) times daily.   Yes [provider]  Multiple Vitamins-Minerals (ALIVE ONCE DAILY WOMENS 50+ PO) Take by mouth.   Yes [provider]  nadolol (CORGARD) 20 MG tablet Take 20 mg by mouth daily.   Yes [provider]  pantoprazole (PROTONIX) 40 MG tablet Take 40 mg by mouth daily.   Yes [provider]  rifaximin (XIFAXAN) 200 MG tablet Take 200 mg by mouth 2 (two) times daily.   Yes [provider]  rosuvastatin (CRESTOR) 10 MG tablet Take 10 mg by mouth daily.   Yes [provider]  sertraline (ZOLOFT) 50 MG tablet Take 1.5 tablets (75 mg total) by mouth daily. 1.5 daily 01/21/17  Yes Glean Hess, MD  azithromycin [facility] Z-PAK) 250 MG tablet UAD Patient not taking: Reported on 02/16/2017 01/21/17   Glean Hess, MD  Insulin Degludec (TRESIBA FLEXTOUCH [place]) Inject 80 Units into the skin daily.    [provider]    Allergies as of 02/17/2017 - Review Complete 02/17/2017  Allergen Reaction Noted  .  Hydrocodone Other (See Comments) 01/21/2017    Family History  Problem Relation Age of Onset  . Breast cancer Mother   . Diabetes Mother   . Diabetes Father   . Hypertension Father   . Cancer Paternal Grandmother     Social History   Social History  . Marital status: Single    Spouse name: N/A  . Number of children: N/A  . Years of education: N/A   Occupational History  . Not on file.   Social History Main Topics  . Smoking status: Never Smoker  . Smokeless tobacco: Never Used  . Alcohol use No  . Drug use: No  . Sexual activity: No   Other Topics Concern  . Not on file   Social History Narrative  . No narrative on file     Review of Systems: See HPI, otherwise negative ROS  Physical Exam: BP 117/70   Pulse 72   Temp 97.9 F (36.6 C) (Temporal)   Resp 16   Ht 5\' 3"  (1.6 m)   Wt 174 lb (78.9 kg)   SpO2 93%   BMI 30.82 kg/m  General:   Alert,  pleasant and cooperative in NAD Head:  Normocephalic and atraumatic. Neck:  Supple; no masses or thyromegaly. Lungs:  Clear throughout to auscultation.    Heart:  Regular rate and rhythm. Abdomen:  Soft, nontender and nondistended. Normal bowel sounds, without guarding, and without rebound.   Neurologic:  Alert and  oriented x4;  grossly normal neurologically.  Impression/Plan: Amber Odonnell is here for an endoscopy to be performed for cirrhosis   Risks, benefits, limitations, and alternatives regarding  endoscopy have been reviewed with the patient.  Questions have been answered.  All parties agreeable.   Amber Lame, MD  02/26/2017, 10:48 AM

## 2017-02-26 NOTE — Transfer of Care (Signed)
Immediate Anesthesia Transfer of Care Note  Patient: Amber Odonnell  Procedure(s) Performed: Procedure(s): ESOPHAGOGASTRODUODENOSCOPY (EGD) WITH Banding (N/A)  Patient Location: PACU  Anesthesia Type: MAC  Level of Consciousness: awake, alert  and patient cooperative  Airway and Oxygen Therapy: Patient Spontanous Breathing and Patient connected to supplemental oxygen  Post-op Assessment: Post-op Vital signs reviewed, Patient's Cardiovascular Status Stable, Respiratory Function Stable, Patent Airway and No signs of Nausea or vomiting  Post-op Vital Signs: Reviewed and stable  Complications: No apparent anesthesia complications

## 2017-03-03 ENCOUNTER — Encounter: Payer: Self-pay | Admitting: Gastroenterology

## 2017-03-11 ENCOUNTER — Encounter: Payer: Self-pay | Admitting: Internal Medicine

## 2017-03-11 DIAGNOSIS — I8500 Esophageal varices without bleeding: Secondary | ICD-10-CM | POA: Insufficient documentation

## 2017-03-13 ENCOUNTER — Encounter: Payer: Self-pay | Admitting: Internal Medicine

## 2017-03-13 ENCOUNTER — Ambulatory Visit (INDEPENDENT_AMBULATORY_CARE_PROVIDER_SITE_OTHER): Payer: BLUE CROSS/BLUE SHIELD | Admitting: Internal Medicine

## 2017-03-13 VITALS — BP 120/88 | HR 82 | Ht 63.0 in | Wt 177.0 lb

## 2017-03-13 DIAGNOSIS — I10 Essential (primary) hypertension: Secondary | ICD-10-CM | POA: Diagnosis not present

## 2017-03-13 DIAGNOSIS — E034 Atrophy of thyroid (acquired): Secondary | ICD-10-CM

## 2017-03-13 DIAGNOSIS — I8500 Esophageal varices without bleeding: Secondary | ICD-10-CM

## 2017-03-13 DIAGNOSIS — H578 Other specified disorders of eye and adnexa: Secondary | ICD-10-CM

## 2017-03-13 DIAGNOSIS — N898 Other specified noninflammatory disorders of vagina: Secondary | ICD-10-CM | POA: Diagnosis not present

## 2017-03-13 DIAGNOSIS — E119 Type 2 diabetes mellitus without complications: Secondary | ICD-10-CM

## 2017-03-13 DIAGNOSIS — Z794 Long term (current) use of insulin: Secondary | ICD-10-CM

## 2017-03-13 DIAGNOSIS — H5789 Other specified disorders of eye and adnexa: Secondary | ICD-10-CM

## 2017-03-13 NOTE — Progress Notes (Signed)
Date:  03/13/2017   Name:  Amber Odonnell   DOB:  01-25-1971   MRN:  [id number]   Chief Complaint: Diabetes and Hypothyroidism Diabetes  She presents for her follow-up diabetic visit. She has type 2 diabetes mellitus. Pertinent negatives for hypoglycemia include no headaches or tremors. Pertinent negatives for diabetes include no chest pain, no fatigue, no polydipsia and no polyuria. Current diabetic treatment includes insulin injections and oral agent (dual therapy).  Thyroid Problem  Presents for follow-up visit. Patient reports no fatigue, palpitations or tremors. The symptoms have been stable.  Hypertension  This is a chronic problem. The problem is controlled. Pertinent negatives include no chest pain, headaches, palpitations or shortness of breath. Identifiable causes of hypertension include a thyroid problem.  Cirrhosis - seen recently by GI - had EGD and banding of esophageal varices.  She denies bleeding issues. She has repeat study in 6 weeks and likely more banding.  She continues to have sx of IBS.  Has not had a colonoscopy in a while.  She will discuss with Dr. Allen Norris. Vaginal flatus -she states that she passes flatus from her vagina.  She has some itching from Jardiance but no vaginal discharge or fecal material. Eye discharge - has clear water from her left eye continually.  Has tried many allergy medications, nasal sprays, etc with no improvement.  The ENT wanted her to take allergy injections.  Lab Results  Component Value Date   HGBA1C 9.8 (H) 01/21/2017   Lab Results  Component Value Date   TSH 1.050 01/21/2017   No results found for: CHOL, HDL, LDLCALC, LDLDIRECT, TRIG, CHOLHDL    Review of Systems  Constitutional: Negative for appetite change, fatigue, fever and unexpected weight change.  HENT: Negative for tinnitus and trouble swallowing.   Eyes: Negative for visual disturbance.  Respiratory: Negative for cough, chest tightness and shortness of breath.     Cardiovascular: Negative for chest pain, palpitations and leg swelling.  Gastrointestinal: Negative for abdominal pain.  Endocrine: Negative for polydipsia and polyuria.  Genitourinary: Negative for decreased urine volume, dysuria, hematuria and vaginal bleeding.       Vaginal itching and sensation of passing air from her vagina  Musculoskeletal: Negative for arthralgias.  Neurological: Negative for tremors, numbness and headaches.  Psychiatric/Behavioral: Negative for dysphoric mood.    Patient Active Problem List   Diagnosis Date Noted  . Esophageal varices determined by endoscopy ([place]) 03/11/2017  . Abnormal feces   . Polyp of sigmoid colon   . Gastritis without bleeding   . Environmental and seasonal allergies 01/21/2017  . Type 2 diabetes mellitus without complication, with long-term current use of insulin ([place]) 01/21/2017  . Hypothyroidism due to acquired atrophy of thyroid 01/21/2017  . Hyperlipidemia associated with type 2 diabetes mellitus ([place]) 01/21/2017  . Irritable bowel syndrome with constipation 01/21/2017  . Obstructive sleep apnea syndrome 01/21/2017  . Thrombocytopenia ([place])   . Splenomegaly   . Portal hypertension ([place])   . Obesity (BMI 30-39.9)   . Essential hypertension   . Hyperlipidemia   . Generalized anxiety disorder   . Hepatic cirrhosis ([place])   . Chronic gastritis   . Arthritis     Prior to Admission medications   Medication Sig Start Date End Date Taking? Authorizing Provider       Glean Hess, MD  B Complex Vitamins (B-COMPLEX/B-12 SL) Place under the tongue every morning.    [provider]  BD PEN NEEDLE NANO U/F 32G X  4 MM MISC Inject 1 each into the skin 4 (four) times daily. 01/09/17   [provider]  diclofenac (VOLTAREN) 75 MG EC tablet Take 75 mg by mouth 2 (two) times daily.    [provider]  empagliflozin (JARDIANCE) 25 MG TABS tablet Take 25 mg by mouth daily. 01/22/17   Glean Hess, MD  insulin  aspart (NOVOLOG FLEXPEN) 100 UNIT/ML FlexPen Inject 26 Units into the skin 3 (three) times daily with meals.    [provider]  Insulin Degludec (TRESIBA FLEXTOUCH [place]) Inject 80 Units into the skin daily.    [provider]  lactulose (CHRONULAC) 10 GM/15ML solution Take 15 mLs (10 g total) by mouth 2 (two) times daily as needed for mild constipation. 02/18/17   [name], MD  levothyroxine (SYNTHROID, LEVOTHROID) 50 MCG tablet Take 50 mcg by mouth daily before breakfast.    [provider]  losartan (COZAAR) 25 MG tablet Take 25 mg by mouth 2 (two) times daily.    [provider]  MEGARED OMEGA-3 KRILL OIL PO Take by mouth.    [provider]  metFORMIN (GLUCOPHAGE-XR) 500 MG 24 hr tablet Take 1,000 mg by mouth 2 (two) times daily.    [provider]  Multiple Vitamins-Minerals (ALIVE ONCE DAILY WOMENS 50+ PO) Take by mouth.    [provider]  nadolol (CORGARD) 20 MG tablet Take 20 mg by mouth daily.    [provider]  pantoprazole (PROTONIX) 40 MG tablet Take 40 mg by mouth daily.    [provider]  rifaximin (XIFAXAN) 200 MG tablet Take 200 mg by mouth 2 (two) times daily.    [provider]  rosuvastatin (CRESTOR) 10 MG tablet Take 10 mg by mouth daily.    [provider]  sertraline (ZOLOFT) 50 MG tablet Take 1.5 tablets (75 mg total) by mouth daily. 1.5 daily 01/21/17   Glean Hess, MD    Allergies  Allergen Reactions  . Hydrocodone Other (See Comments)    thrush    Past Surgical History:  Procedure Laterality Date  . ESOPHAGOGASTRODUODENOSCOPY (EGD) WITH PROPOFOL N/A 02/26/2017   Procedure: ESOPHAGOGASTRODUODENOSCOPY (EGD) WITH Banding;  Surgeon: [name], MD;  Location: [facility];  Service: Endoscopy;  Laterality: N/A;  . TONSILLECTOMY    . TOTAL VAGINAL HYSTERECTOMY     endometriosis    Social History  Substance Use Topics  . Smoking status: Never Smoker    . Smokeless tobacco: Never Used  . Alcohol use No     Medication list has been reviewed and updated.   Physical Exam  Constitutional: She is oriented to person, place, and time. She appears well-developed. No distress.  HENT:  Head: Normocephalic and atraumatic.  Neck: Normal range of motion. Neck supple.  Cardiovascular: Normal rate, regular rhythm and normal heart sounds.   Pulmonary/Chest: Effort normal and breath sounds normal. No respiratory distress. She has no wheezes.  Musculoskeletal: She exhibits no edema or tenderness.  Neurological: She is alert and oriented to person, place, and time.  Skin: Skin is warm and dry. No rash noted.  Psychiatric: She has a normal mood and affect. Her behavior is normal. Thought content normal.  Nursing note and vitals reviewed.   BP 120/88   Pulse 82   Ht 5\' 3"  (1.6 m)   Wt 177 lb (80.3 kg)   BMI 31.35 kg/m   Assessment and Plan: 1. Type 2 diabetes mellitus without complication, with long-term current use  of insulin ([place]) Continue high dose insulin and oral agents May need Endo referral - Hemoglobin O5F - Basic metabolic panel  2. Hypothyroidism due to acquired atrophy of thyroid Last TSH was normal Lab Results  Component Value Date   TSH 1.050 01/21/2017     3. Esophageal varices determined by endoscopy ([place]) Follow up with Dr. Allen Norris  4. Essential hypertension controlled  5. Eye discharge May have blocker tear duct or other pathology - Ambulatory referral to Ophthalmology  6. Vaginal flatus Recommend GYN evaluation - Ambulatory referral to Gynecology   No orders of the defined types were placed in this encounter.   Halina Maidens, MD [facility]  03/13/2017

## 2017-03-14 ENCOUNTER — Other Ambulatory Visit: Payer: Self-pay | Admitting: Internal Medicine

## 2017-03-14 DIAGNOSIS — Z794 Long term (current) use of insulin: Principal | ICD-10-CM

## 2017-03-14 DIAGNOSIS — E119 Type 2 diabetes mellitus without complications: Secondary | ICD-10-CM

## 2017-03-14 LAB — BASIC METABOLIC PANEL
BUN/Creatinine Ratio: 15 (ref 9–23)
BUN: 8 mg/dL (ref 6–24)
CHLORIDE: 104 mmol/L (ref 96–106)
CO2: 22 mmol/L (ref 18–29)
Calcium: 8.8 mg/dL (ref 8.7–10.2)
Creatinine, Ser: 0.52 mg/dL — ABNORMAL LOW (ref 0.57–1.00)
GFR calc Af Amer: 131 mL/min/{1.73_m2} (ref >59)
GFR calc non Af Amer: 113 mL/min/{1.73_m2} (ref >59)
GLUCOSE: 340 mg/dL — AB (ref 65–99)
Potassium: 4.1 mmol/L (ref 3.5–5.2)
SODIUM: 141 mmol/L (ref 134–144)

## 2017-03-14 LAB — HEMOGLOBIN A1C
ESTIMATED AVERAGE GLUCOSE: 263 mg/dL
Hgb A1c MFr Bld: 10.8 % — ABNORMAL HIGH (ref 4.8–5.6)

## 2017-03-18 ENCOUNTER — Telehealth: Payer: Self-pay | Admitting: Obstetrics & Gynecology

## 2017-03-18 NOTE — Telephone Encounter (Signed)
Pt is being  Referred by [facility] for Vaginal Flatus. Unable to reach patient do to phone being disconnected or not in service. Pt to be schedule with an MD.

## 2017-03-20 ENCOUNTER — Encounter: Payer: Self-pay | Admitting: Gynecology

## 2017-03-20 ENCOUNTER — Ambulatory Visit
Admission: EM | Admit: 2017-03-20 | Discharge: 2017-03-20 | Disposition: A | Discharge status: Home / Self Care | Payer: BLUE CROSS/BLUE SHIELD | Attending: Emergency Medicine | Admitting: Emergency Medicine

## 2017-03-20 DIAGNOSIS — L600 Ingrowing nail: Secondary | ICD-10-CM | POA: Diagnosis not present

## 2017-03-20 MED ORDER — MUPIROCIN 2 % EX OINT: 1.0000 "application " | TOPICAL_OINTMENT | Freq: Three times a day (TID) | CUTANEOUS | 0 refills | Status: DC

## 2017-03-20 MED ORDER — SULFAMETHOXAZOLE-TRIMETHOPRIM 800-160 MG PO TABS: 1.0000 | ORAL_TABLET | Freq: Two times a day (BID) | ORAL | 0 refills | Status: DC

## 2017-03-20 NOTE — ED Triage Notes (Signed)
Patient c/o ingrown toenail at right big toe x 4 days.

## 2017-03-20 NOTE — Discharge Instructions (Signed)
Apply warm compresses to your toe 3 times daily as instructed. Keep on for 10 minutes. May need to repeat a microwave  reheat to keep the compresses  warm. Dry thoroughly and apply Bactroban ointment to the nail fold and all reddened areas on the toe. If not noticing any improvement in 3-5 days contact your primary care physician.

## 2017-03-20 NOTE — Telephone Encounter (Signed)
Called and lvm for patient to call back to be schedule.x2 °  °

## 2017-03-20 NOTE — ED Provider Notes (Signed)
CSN: [id number]     Arrival date & time 03/20/17  1707 History   First MD Initiated Contact with Patient 03/20/17 1754     Chief Complaint  Patient presents with  . Nail Problem   (Consider location/radiation/quality/duration/timing/severity/associated sxs/prior Treatment) HPI  Is a 46 year old female who presents with an ingrown toenail on her right great toe for 4 days. She is an insulin-dependent diabetic. Attempting to treat the nail on her own but has found that it is only worsening. She states that this morning it did drain some purulent material. She has also been cutting back on her nail thinking that it would grow faster.        Past Medical History:  Diagnosis Date  . Arthritis    of knee  . Asthma   . Chronic gastritis   . Cirrhosis of liver without ascites ([place])   . Diabetes mellitus without complication ([place])    type 2  . Dyspnea   . Generalized anxiety disorder   . GERD (gastroesophageal reflux disease)   . Hyperlipidemia   . Hypertension   . Hypothyroidism   . Irritable bowel syndrome (IBS)    w/ constipation  . Obesity (BMI 30-39.9)   . Portal hypertension ([place])   . Sleep apnea   . Splenomegaly   . Thrombocytopenia ([place])    Past Surgical History:  Procedure Laterality Date  . ESOPHAGOGASTRODUODENOSCOPY (EGD) WITH PROPOFOL N/A 02/26/2017   Procedure: ESOPHAGOGASTRODUODENOSCOPY (EGD) WITH Banding;  Surgeon: [name], MD;  Location: [facility];  Service: Endoscopy;  Laterality: N/A;  . TONSILLECTOMY    . TOTAL VAGINAL HYSTERECTOMY     endometriosis   Family History  Problem Relation Age of Onset  . Breast cancer Mother   . Diabetes Mother   . Diabetes Father   . Hypertension Father   . Cancer Paternal Grandmother    Social History  Substance Use Topics  . Smoking status: Never Smoker  . Smokeless tobacco: Never Used  . Alcohol use No   OB History    No data available     Review of Systems  Constitutional: Positive for  activity change. Negative for chills, fatigue and fever.  Skin: Positive for wound.  All other systems reviewed and are negative.   Allergies  Hydrocodone  Home Medications   Prior to Admission medications   Medication Sig Start Date End Date Taking? Authorizing Provider  B Complex Vitamins (B-COMPLEX/B-12 SL) Place under the tongue every morning.   Yes [provider]  BD PEN NEEDLE NANO U/F 32G X 4 MM MISC Inject 1 each into the skin 4 (four) times daily. 01/09/17  Yes [provider]  diclofenac (VOLTAREN) 75 MG EC tablet Take 75 mg by mouth 2 (two) times daily.   Yes [provider]  empagliflozin (JARDIANCE) 25 MG TABS tablet Take 25 mg by mouth daily. 01/22/17  Yes Glean Hess, MD  GENERLAC 10 GM/15ML SOLN TK 15 ML PO BID 02/18/17  Yes [provider]  insulin aspart (NOVOLOG FLEXPEN) 100 UNIT/ML FlexPen Inject 60 Units into the skin 3 (three) times daily with meals.    Yes [provider]  Insulin Degludec (TRESIBA FLEXTOUCH [place]) Inject 80 Units into the skin daily.   Yes [provider]  lactulose (CHRONULAC) 10 GM/15ML solution Take 15 mLs (10 g total) by mouth 2 (two) times daily as needed for mild constipation. 02/18/17  Yes [name], MD  levothyroxine (SYNTHROID, LEVOTHROID) 50 MCG tablet Take 50  mcg by mouth daily before breakfast.   Yes [provider]  losartan (COZAAR) 25 MG tablet Take 25 mg by mouth 2 (two) times daily.   Yes [provider]  MEGARED OMEGA-3 KRILL OIL PO Take by mouth.   Yes [provider]  metFORMIN (GLUCOPHAGE-XR) 500 MG 24 hr tablet Take 1,000 mg by mouth 2 (two) times daily.   Yes [provider]  Multiple Vitamins-Minerals (ALIVE ONCE DAILY WOMENS 50+ PO) Take by mouth.   Yes [provider]  nadolol (CORGARD) 20 MG tablet Take 20 mg by mouth daily.   Yes [provider]  pantoprazole (PROTONIX) 40 MG tablet Take 40 mg by mouth daily.   Yes  [provider]  rifaximin (XIFAXAN) 200 MG tablet Take 200 mg by mouth 2 (two) times daily.   Yes [provider]  rosuvastatin (CRESTOR) 10 MG tablet Take 10 mg by mouth daily.   Yes [provider]  sertraline (ZOLOFT) 50 MG tablet Take 1.5 tablets (75 mg total) by mouth daily. 1.5 daily 01/21/17  Yes Glean Hess, MD  mupirocin ointment (BACTROBAN) 2 % Apply 1 application topically 3 (three) times daily. 03/20/17   Lorin Picket, PA-C  sulfamethoxazole-trimethoprim (BACTRIM DS,SEPTRA DS) 800-160 MG tablet Take 1 tablet by mouth 2 (two) times daily. 03/20/17   Lorin Picket, PA-C   Meds Ordered and Administered this Visit  Medications - No data to display  BP 132/77 (BP Location: Left Arm)   Pulse 64   Temp 98.5 F (36.9 C) (Oral)   Resp 14   Ht 5\' 3"  (1.6 m)   Wt 174 lb (78.9 kg)   SpO2 98%   BMI 30.82 kg/m  No data found.   Physical Exam  Constitutional: She appears well-developed and well-nourished. No distress.  HENT:  Head: Normocephalic.  Eyes: Pupils are equal, round, and reactive to light. Right eye exhibits no discharge. Left eye exhibits no discharge.  Neck: Normal range of motion.  Musculoskeletal: Normal range of motion.  Skin: Skin is warm and dry. She is not diaphoretic.  Examination of the right great toe shows swelling erythema of the medial aspect of the great toe. In the nail fold there is some dried serous fluid. Is very tender to touch and warm.  Psychiatric: She has a normal mood and affect. Her behavior is normal. Judgment and thought content normal.  Nursing note and vitals reviewed.   Urgent Care Course     Procedures (including critical care time)  Labs Review Labs Reviewed - No data to display  Imaging Review No results found.   Visual Acuity Review  Right Eye Distance:   Left Eye Distance:   Bilateral Distance:    Right Eye Near:   Left Eye Near:    Bilateral Near:     Procedure:  Patient's  great toe was prepped with chlorhexidine solution. An 18-gauge needle was then utilized to gently tease under the nail fold. No purulence was encountered. She did have bleeding. The procedure was terminated at this point. Patient tolerated well. A dry sterile dressing was applied. We'll begin a program of warm compresses 3x daily followed by application of Bactroban ointment to the nail fold and surrounding erythematous area. I'll start her on 5 days of Septra.  Because of her diabetes, recommended that it is not improving in 3-5 days she should contact her primary care physician for consideration of referral to podiatrist.    MDM   1.  Ingrowing nail, right great toe    Discharge Medication List as of 03/20/2017  6:25 PM    START taking these medications   Details  mupirocin ointment (BACTROBAN) 2 % Apply 1 application topically 3 (three) times daily., Starting Fri 03/20/2017, Normal    sulfamethoxazole-trimethoprim (BACTRIM DS,SEPTRA DS) 800-160 MG tablet Take 1 tablet by mouth 2 (two) times daily., Starting Fri 03/20/2017, Normal      Plan: 1. Test/x-ray results and diagnosis reviewed with patient 2. rx as per orders; risks, benefits, potential side effects reviewed with patient 3. Recommend supportive treatment with Elevation  as necessary. See procedure note above for directions. All of his primary care physician in 3-5 days.improving. 4. F/u prn if symptoms worsen or don't improve     Lorin Picket, PA-C 03/20/17 [id number]

## 2017-03-23 NOTE — Telephone Encounter (Signed)
Called and left voicemail for patient to call back to be schedule. x3 °  °

## 2017-03-25 ENCOUNTER — Ambulatory Visit (INDEPENDENT_AMBULATORY_CARE_PROVIDER_SITE_OTHER): Payer: BLUE CROSS/BLUE SHIELD | Admitting: Obstetrics & Gynecology

## 2017-03-25 ENCOUNTER — Encounter: Payer: Self-pay | Admitting: Obstetrics & Gynecology

## 2017-03-25 VITALS — BP 110/70 | HR 76 | Ht 63.0 in | Wt 176.0 lb

## 2017-03-25 DIAGNOSIS — N898 Other specified noninflammatory disorders of vagina: Secondary | ICD-10-CM | POA: Diagnosis not present

## 2017-03-25 NOTE — Progress Notes (Signed)
Pt is 46 yo s/p TLH BSO for endometriosis 3 years ago at [facility], now with 6 mos h/o odorless intermittant vaginal flatus, no known association or stimulus.  Has IBS, DM, mild GSI.  Occas vaginitis w d/c due to DM and meds; takes Monistat.  Constipation often with IBS, so sx's overlap.  Sees Dr Allen Norris.   PMHx: She  has a past medical history of Arthritis; Asthma; Chronic gastritis; Cirrhosis of liver without ascites ([place]); Diabetes mellitus without complication ([place]); Dyspnea; Generalized anxiety disorder; GERD (gastroesophageal reflux disease); Hyperlipidemia; Hypertension; Hypothyroidism; Irritable bowel syndrome (IBS); Obesity (BMI 30-39.9); Portal hypertension ([place]); Sleep apnea; Splenomegaly; and Thrombocytopenia ([place]). Also,  has a past surgical history that includes Tonsillectomy; Total vaginal hysterectomy; and Esophagogastroduodenoscopy (egd) with propofol (N/A, 02/26/2017)., family history includes Breast cancer in her mother; Cancer in her paternal grandmother; Diabetes in her father and mother; Hypertension in her father.,  reports that she has never smoked. She has never used smokeless tobacco. She reports that she does not drink alcohol or use drugs.  She has a current medication list which includes the following prescription(s): b complex vitamins, bd pen needle nano u/f, diclofenac, empagliflozin, generlac, insulin aspart, insulin degludec, lactulose, levothyroxine, losartan, krill oil, metformin, multiple vitamins-minerals, mupirocin ointment, nadolol, pantoprazole, rifaximin, rosuvastatin, sertraline, and sulfamethoxazole-trimethoprim. Also, is allergic to hydrocodone.  Review of Systems  Constitutional: Negative for chills, fever and malaise/fatigue.  HENT: Negative for congestion, sinus pain and sore throat.   Eyes: Negative for blurred vision and pain.  Respiratory: Negative for cough and wheezing.   Cardiovascular: Negative for chest pain and leg swelling.  Gastrointestinal:  Negative for abdominal pain, constipation, diarrhea, heartburn, nausea and vomiting.  Genitourinary: Negative for dysuria, frequency, hematuria and urgency.  Musculoskeletal: Negative for back pain, joint pain, myalgias and neck pain.  Skin: Negative for itching and rash.  Neurological: Negative for dizziness, tremors and weakness.  Endo/Heme/Allergies: Does not bruise/bleed easily.  Psychiatric/Behavioral: Negative for depression. The patient is not nervous/anxious and does not have insomnia.     Objective: BP 110/70   Pulse 76   Ht 5\' 3"  (1.6 m)   Wt 176 lb (79.8 kg)   BMI 31.18 kg/m  Physical Exam  Constitutional: She is oriented to person, place, and time. She appears well-developed and well-nourished. No distress.  Genitourinary: Rectum normal and vagina normal. Pelvic exam was performed with patient supine. There is no rash or lesion on the right labia. There is no rash or lesion on the left labia. Vagina exhibits no lesion. No bleeding in the vagina. Right adnexum does not display mass and does not display tenderness. Left adnexum does not display mass and does not display tenderness.  Genitourinary Comments: Absent Uterus Absent cervix Vaginal cuff well healed No fistula or defect seen or palpated  HENT:  Head: Normocephalic and atraumatic. Head is without laceration.  Right Ear: Hearing normal.  Left Ear: Hearing normal.  Nose: No epistaxis.  No foreign bodies.  Mouth/Throat: Uvula is midline, oropharynx is clear and moist and mucous membranes are normal.  Eyes: Pupils are equal, round, and reactive to light.  Neck: Normal range of motion. Neck supple. No thyromegaly present.  Cardiovascular: Normal rate and regular rhythm.  Exam reveals no gallop and no friction rub.   No murmur heard. Pulmonary/Chest: Effort normal and breath sounds normal. No respiratory distress. She has no wheezes. Right breast exhibits no mass, no skin change and no tenderness. Left breast exhibits no  mass, no skin change and no  tenderness.  Abdominal: Soft. Bowel sounds are normal. She exhibits no distension. There is no tenderness. There is no rebound.  Musculoskeletal: Normal range of motion.  Neurological: She is alert and oriented to person, place, and time. No cranial nerve deficit.  Skin: Skin is warm and dry.  Psychiatric: She has a normal mood and affect. Judgment normal.  Vitals reviewed.   ASSESSMENT/PLAN:  Vaginal Flatus with no fistula Problem List Items Addressed This Visit      Other   Vaginal flatus - Primary    Monitor sx's; air trapping of some sort a likely cause.  Kegels may help. IBS tx may help from overlapping sx's.  Referral with tertiary exam and dye study as a next step if bothersome or worrisome to patient.  Barnett Applebaum, MD, [facility], [facility] 03/25/2017  3:32 PM

## 2017-03-27 DIAGNOSIS — G4733 Obstructive sleep apnea (adult) (pediatric): Secondary | ICD-10-CM | POA: Diagnosis not present

## 2017-03-31 ENCOUNTER — Other Ambulatory Visit: Payer: Self-pay

## 2017-03-31 DIAGNOSIS — I8511 Secondary esophageal varices with bleeding: Secondary | ICD-10-CM

## 2017-04-03 NOTE — Discharge Instructions (Signed)
General Anesthesia, Adult, Care After °These instructions provide you with information about caring for yourself after your procedure. Your health care provider may also give you more specific instructions. Your treatment has been planned according to current medical practices, but problems sometimes occur. Call your health care provider if you have any problems or questions after your procedure. °What can I expect after the procedure? °After the procedure, it is common to have: °· Vomiting. °· A sore throat. °· Mental slowness. ° °It is common to feel: °· Nauseous. °· Cold or shivery. °· Sleepy. °· Tired. °· Sore or achy, even in parts of your body where you did not have surgery. ° °Follow these instructions at home: °For at least 24 hours after the procedure: °· Do not: °? Participate in activities where you could fall or become injured. °? Drive. °? Use heavy machinery. °? Drink alcohol. °? Take sleeping pills or medicines that cause drowsiness. °? Make important decisions or sign legal documents. °? Take care of children on your own. °· Rest. °Eating and drinking °· If you vomit, drink water, juice, or soup when you can drink without vomiting. °· Drink enough fluid to keep your urine clear or pale yellow. °· Make sure you have little or no nausea before eating solid foods. °· Follow the diet recommended by your health care provider. °General instructions °· Have a responsible adult stay with you until you are awake and alert. °· Return to your normal activities as told by your health care provider. Ask your health care provider what activities are safe for you. °· Take over-the-counter and prescription medicines only as told by your health care provider. °· If you smoke, do not smoke without supervision. °· Keep all follow-up visits as told by your health care provider. This is important. °Contact a health care provider if: °· You continue to have nausea or vomiting at home, and medicines are not helpful. °· You  cannot drink fluids or start eating again. °· You cannot urinate after 8-12 hours. °· You develop a skin rash. °· You have fever. °· You have increasing redness at the site of your procedure. °Get help right away if: °· You have difficulty breathing. °· You have chest pain. °· You have unexpected bleeding. °· You feel that you are having a life-threatening or urgent problem. °This information is not intended to replace advice given to you by your health care provider. Make sure you discuss any questions you have with your health care provider. °Document Released: 12/29/2000 Document Revised: 02/25/2016 Document Reviewed: 09/06/2015 °Elsevier Interactive Patient Education © 2018 Elsevier Inc. ° °

## 2017-04-06 ENCOUNTER — Encounter: Payer: Self-pay | Admitting: *Deleted

## 2017-04-10 ENCOUNTER — Encounter
Admission: RE | Disposition: A | Discharge status: Home / Self Care | Payer: Self-pay | Source: Ambulatory Visit | Attending: Gastroenterology

## 2017-04-10 ENCOUNTER — Other Ambulatory Visit: Payer: Self-pay | Admitting: Gastroenterology

## 2017-04-10 ENCOUNTER — Ambulatory Visit
Admission: RE | Admit: 2017-04-10 | Discharge: 2017-04-10 | Disposition: A | Discharge status: Home / Self Care | Payer: BLUE CROSS/BLUE SHIELD | Source: Ambulatory Visit | Attending: Gastroenterology | Admitting: Gastroenterology

## 2017-04-10 ENCOUNTER — Ambulatory Visit: Payer: BLUE CROSS/BLUE SHIELD | Admitting: Anesthesiology

## 2017-04-10 DIAGNOSIS — K7460 Unspecified cirrhosis of liver: Secondary | ICD-10-CM | POA: Insufficient documentation

## 2017-04-10 DIAGNOSIS — K766 Portal hypertension: Secondary | ICD-10-CM | POA: Diagnosis not present

## 2017-04-10 DIAGNOSIS — M1990 Unspecified osteoarthritis, unspecified site: Secondary | ICD-10-CM | POA: Diagnosis not present

## 2017-04-10 DIAGNOSIS — K589 Irritable bowel syndrome without diarrhea: Secondary | ICD-10-CM | POA: Insufficient documentation

## 2017-04-10 DIAGNOSIS — E119 Type 2 diabetes mellitus without complications: Secondary | ICD-10-CM | POA: Diagnosis not present

## 2017-04-10 DIAGNOSIS — F419 Anxiety disorder, unspecified: Secondary | ICD-10-CM | POA: Diagnosis not present

## 2017-04-10 DIAGNOSIS — Z794 Long term (current) use of insulin: Secondary | ICD-10-CM | POA: Diagnosis not present

## 2017-04-10 DIAGNOSIS — J45909 Unspecified asthma, uncomplicated: Secondary | ICD-10-CM | POA: Diagnosis not present

## 2017-04-10 DIAGNOSIS — I8511 Secondary esophageal varices with bleeding: Secondary | ICD-10-CM | POA: Diagnosis not present

## 2017-04-10 DIAGNOSIS — K219 Gastro-esophageal reflux disease without esophagitis: Secondary | ICD-10-CM | POA: Insufficient documentation

## 2017-04-10 DIAGNOSIS — G4730 Sleep apnea, unspecified: Secondary | ICD-10-CM | POA: Diagnosis not present

## 2017-04-10 DIAGNOSIS — I8500 Esophageal varices without bleeding: Secondary | ICD-10-CM | POA: Diagnosis not present

## 2017-04-10 DIAGNOSIS — Z79899 Other long term (current) drug therapy: Secondary | ICD-10-CM | POA: Diagnosis not present

## 2017-04-10 DIAGNOSIS — E669 Obesity, unspecified: Secondary | ICD-10-CM | POA: Diagnosis not present

## 2017-04-10 DIAGNOSIS — I10 Essential (primary) hypertension: Secondary | ICD-10-CM | POA: Diagnosis not present

## 2017-04-10 DIAGNOSIS — E785 Hyperlipidemia, unspecified: Secondary | ICD-10-CM | POA: Diagnosis not present

## 2017-04-10 DIAGNOSIS — E039 Hypothyroidism, unspecified: Secondary | ICD-10-CM | POA: Insufficient documentation

## 2017-04-10 HISTORY — PX: ESOPHAGOGASTRODUODENOSCOPY (EGD) WITH PROPOFOL: SHX5813

## 2017-04-10 LAB — GLUCOSE, CAPILLARY
GLUCOSE-CAPILLARY: 126 mg/dL — AB (ref 65–99)
GLUCOSE-CAPILLARY: 123 mg/dL — AB (ref 65–99)

## 2017-04-10 SURGERY — ESOPHAGOGASTRODUODENOSCOPY (EGD) WITH PROPOFOL
Anesthesia: General | Wound class: Clean Contaminated

## 2017-04-10 MED ORDER — ACETAMINOPHEN 160 MG/5ML PO SOLN: 325.0000 mg | ORAL | Status: DC | PRN

## 2017-04-10 MED ORDER — SODIUM CHLORIDE 0.9 % IV SOLN: INTRAVENOUS | Status: DC

## 2017-04-10 MED ORDER — FUROSEMIDE 40 MG PO TABS: 40.0000 mg | ORAL_TABLET | Freq: Every day | ORAL | 11 refills | Status: AC

## 2017-04-10 MED ORDER — LACTATED RINGERS IV SOLN
INTRAVENOUS | Status: DC | PRN
  Administered 2017-04-10: 09:00:00 via INTRAVENOUS

## 2017-04-10 MED ORDER — LIDOCAINE HCL (CARDIAC) 20 MG/ML IV SOLN
INTRAVENOUS | Status: DC | PRN
  Administered 2017-04-10: 40 mg via INTRAVENOUS

## 2017-04-10 MED ORDER — LACTATED RINGERS IV SOLN: INTRAVENOUS | Status: DC

## 2017-04-10 MED ORDER — FENTANYL CITRATE (PF) 100 MCG/2ML IJ SOLN: 25.0000 ug | INTRAMUSCULAR | Status: DC

## 2017-04-10 MED ORDER — ACETAMINOPHEN 325 MG PO TABS: 325.0000 mg | ORAL_TABLET | ORAL | Status: DC | PRN

## 2017-04-10 MED ORDER — OXYCODONE HCL 5 MG/5ML PO SOLN
5.0000 mg | Freq: Once | ORAL | Status: AC
  Administered 2017-04-10: 5 mg via ORAL

## 2017-04-10 MED ORDER — FENTANYL CITRATE (PF) 100 MCG/2ML IJ SOLN
25.0000 ug | INTRAMUSCULAR | Status: DC | PRN
  Administered 2017-04-10 (×3): 25 ug via INTRAVENOUS

## 2017-04-10 MED ORDER — PROPOFOL 10 MG/ML IV BOLUS
INTRAVENOUS | Status: DC | PRN
  Administered 2017-04-10 (×2): 50 mg via INTRAVENOUS
  Administered 2017-04-10: 100 mg via INTRAVENOUS
  Administered 2017-04-10 (×2): 50 mg via INTRAVENOUS

## 2017-04-10 MED ORDER — GLYCOPYRROLATE 0.2 MG/ML IJ SOLN
INTRAMUSCULAR | Status: DC | PRN
  Administered 2017-04-10: 0.2 mg via INTRAVENOUS

## 2017-04-10 SURGICAL SUPPLY — 33 items
BALLN DILATOR 10-12 8 (BALLOONS)
BALLN DILATOR 12-15 8 (BALLOONS)
BALLN DILATOR 15-18 8 (BALLOONS)
BALLN DILATOR CRE 0-12 8 (BALLOONS)
BALLN DILATOR ESOPH 8 10 CRE (MISCELLANEOUS) IMPLANT
BALLOON DILATOR 12-15 8 (BALLOONS) IMPLANT
BALLOON DILATOR 15-18 8 (BALLOONS) IMPLANT
BALLOON DILATOR CRE 0-12 8 (BALLOONS) IMPLANT
BLOCK BITE 60FR ADLT L/F GRN (MISCELLANEOUS) ×2 IMPLANT
CANISTER SUCT 1200ML W/VALVE (MISCELLANEOUS) ×2 IMPLANT
CLIP HMST 235XBRD CATH ROT (MISCELLANEOUS) IMPLANT
CLIP RESOLUTION 360 11X235 (MISCELLANEOUS)
FCP ESCP3.2XJMB 240X2.8X (MISCELLANEOUS)
FORCEPS BIOP RAD 4 LRG CAP 4 (CUTTING FORCEPS) IMPLANT
FORCEPS BIOP RJ4 240 W/NDL (MISCELLANEOUS)
FORCEPS ESCP3.2XJMB 240X2.8X (MISCELLANEOUS) IMPLANT
GOWN CVR UNV OPN BCK APRN NK (MISCELLANEOUS) ×2 IMPLANT
GOWN ISOL THUMB LOOP REG UNIV (MISCELLANEOUS) ×2
INJECTOR VARIJECT VIN23 (MISCELLANEOUS) IMPLANT
KIT DEFENDO VALVE AND CONN (KITS) IMPLANT
KIT ENDO PROCEDURE OLY (KITS) ×2 IMPLANT
LIGATOR MULTIBAND 6SHOOTER MBL (MISCELLANEOUS) ×2 IMPLANT
MARKER SPOT ENDO TATTOO 5ML (MISCELLANEOUS) IMPLANT
PAD GROUND ADULT SPLIT (MISCELLANEOUS) IMPLANT
RETRIEVER NET PLAT FOOD (MISCELLANEOUS) IMPLANT
SNARE SHORT THROW 13M SML OVAL (MISCELLANEOUS) IMPLANT
SNARE SHORT THROW 30M LRG OVAL (MISCELLANEOUS) IMPLANT
SPOT EX ENDOSCOPIC TATTOO (MISCELLANEOUS)
SYR INFLATION 60ML (SYRINGE) IMPLANT
TRAP ETRAP POLY (MISCELLANEOUS) IMPLANT
VARIJECT INJECTOR VIN23 (MISCELLANEOUS)
WATER STERILE IRR 250ML POUR (IV SOLUTION) ×2 IMPLANT
WIRE CRE 18-20MM 8CM F G (MISCELLANEOUS) IMPLANT

## 2017-04-10 NOTE — Anesthesia Procedure Notes (Signed)
Procedure Name: MAC Date/Time: 04/10/2017 9:00 AM Performed by: Janna Arch Pre-anesthesia Checklist: Patient identified, Emergency Drugs available, Suction available and Patient being monitored Patient Re-evaluated:Patient Re-evaluated prior to inductionOxygen Delivery Method: Nasal cannula

## 2017-04-10 NOTE — Op Note (Signed)
[facility] Patient Name: Amber Odonnell Procedure Date: 04/10/2017 8:55 AM MRN: [id number] Account #: [id number] Date of Birth: [birth year] Admit Type: Outpatient Age: 46 Room: [facility] OR ROOM 01 Gender: Female Note Status: Finalized Procedure:            Upper GI endoscopy Indications:          Esophageal varices Providers:            [name] MD, MD Referring MD:         Halina Maidens, MD (Referring MD) Medicines:            Propofol per Anesthesia Complications:        No immediate complications. Procedure:            Pre-Anesthesia Assessment:                       - Prior to the procedure, a History and Physical was                        performed, and patient medications and allergies were                        reviewed. The patient's tolerance of previous                        anesthesia was also reviewed. The risks and benefits of                        the procedure and the sedation options and risks were                        discussed with the patient. All questions were                        answered, and informed consent was obtained. Prior                        Anticoagulants: The patient has taken no previous                        anticoagulant or antiplatelet agents. ASA Grade                        Assessment: II - A patient with mild systemic disease.                        After reviewing the risks and benefits, the patient was                        deemed in satisfactory condition to undergo the                        procedure.                       After obtaining informed consent, the endoscope was                        passed under direct vision. Throughout the procedure,  the patient's blood pressure, pulse, and oxygen                        saturations were monitored continuously. The Olympus                        190 Endoscope [id number]) was introduced through the   mouth, and advanced to the second part of duodenum. The                        upper GI endoscopy was accomplished without difficulty.                        The patient tolerated the procedure well. Findings:      Grade III varices were found in the lower third of the esophagus. Three       bands were successfully placed with complete eradication, resulting in       deflation of varices. There was no bleeding during the procedure.      The stomach was normal.      The examined duodenum was normal. Impression:           - Grade III esophageal varices. Completely eradicated.                        Banded.                       - Normal stomach.                       - Normal examined duodenum.                       - No specimens collected. Recommendation:       - Discharge patient to home.                       - Soft diet for 2 days. Procedure Code(s):    --- Professional ---                       [phone], Esophagogastroduodenoscopy, flexible, transoral;                        with band ligation of esophageal/gastric varices Diagnosis Code(s):    --- Professional ---                       I85.00, Esophageal varices without bleeding CPT copyright 2016 American Medical Association. All rights reserved. The codes documented in this report are preliminary and upon coder review may  be revised to meet current compliance requirements. [name] MD, MD 04/10/2017 9:16:03 AM This report has been signed electronically. Number of Addenda: 0 Note Initiated On: 04/10/2017 8:55 AM Total Procedure Duration: 0 hours 7 minutes 28 seconds       [facility]

## 2017-04-10 NOTE — Anesthesia Postprocedure Evaluation (Signed)
Anesthesia Post Note  Patient: Amber Odonnell  Procedure(s) Performed: Procedure(s) (LRB): ESOPHAGOGASTRODUODENOSCOPY (EGD) WITH PROPOFOL (N/A)  Patient location during evaluation: PACU Anesthesia Type: General Level of consciousness: awake and alert and oriented Pain management: satisfactory to patient Vital Signs Assessment: post-procedure vital signs reviewed and stable Respiratory status: spontaneous breathing, nonlabored ventilation and respiratory function stable Cardiovascular status: blood pressure returned to baseline and stable Postop Assessment: Adequate PO intake and No signs of nausea or vomiting Anesthetic complications: no    Raliegh Ip

## 2017-04-10 NOTE — H&P (Signed)
[name], MD [facility] [address], [place], [place] [address] Phone:[phone] Fax : [phone]  Primary Care Physician:  Glean Hess, MD Primary Gastroenterologist:  Dr. Allen Norris  Pre-Procedure History & Physical: HPI:  Amber Odonnell is a 46 y.o. female is here for an endoscopy.   Past Medical History:  Diagnosis Date  . Arthritis    of knee  . Asthma   . Chronic gastritis   . Cirrhosis of liver without ascites ([place])   . Diabetes mellitus without complication ([place])    type 2  . Dyspnea   . Generalized anxiety disorder   . GERD (gastroesophageal reflux disease)   . Hyperlipidemia   . Hypertension   . Hypothyroidism   . Irritable bowel syndrome (IBS)    w/ constipation  . Obesity (BMI 30-39.9)   . Portal hypertension ([place])   . Sleep apnea    CPAP  . Splenomegaly   . Thrombocytopenia ([place])     Past Surgical History:  Procedure Laterality Date  . ESOPHAGOGASTRODUODENOSCOPY (EGD) WITH PROPOFOL N/A 02/26/2017   Procedure: ESOPHAGOGASTRODUODENOSCOPY (EGD) WITH Banding;  Surgeon: [name], MD;  Location: [facility];  Service: Endoscopy;  Laterality: N/A;  . TONSILLECTOMY    . TOTAL VAGINAL HYSTERECTOMY     endometriosis    Prior to Admission medications   Medication Sig Start Date End Date Taking? Authorizing Provider  B Complex Vitamins (B-COMPLEX/B-12 SL) Place under the tongue every morning.   Yes [provider]  diclofenac (VOLTAREN) 75 MG EC tablet Take 75 mg by mouth 2 (two) times daily.   Yes [provider]  empagliflozin (JARDIANCE) 25 MG TABS tablet Take 25 mg by mouth daily. 01/22/17  Yes Glean Hess, MD  GENERLAC 10 GM/15ML SOLN TK 15 ML PO BID 02/18/17  Yes [provider]  insulin aspart (NOVOLOG FLEXPEN) 100 UNIT/ML FlexPen Inject 60 Units into the skin 3 (three) times daily with meals.    Yes [provider]  Insulin Degludec (TRESIBA FLEXTOUCH [place]) Inject 80 Units into the skin daily.    Yes [provider]  lactulose (CHRONULAC) 10 GM/15ML solution Take 15 mLs (10 g total) by mouth 2 (two) times daily as needed for mild constipation. 02/18/17  Yes [name], MD  levothyroxine (SYNTHROID, LEVOTHROID) 50 MCG tablet Take 50 mcg by mouth daily before breakfast.   Yes [provider]  losartan (COZAAR) 25 MG tablet Take 25 mg by mouth 2 (two) times daily.   Yes [provider]  MEGARED OMEGA-3 KRILL OIL PO Take by mouth.   Yes [provider]  metFORMIN (GLUCOPHAGE-XR) 500 MG 24 hr tablet Take 1,000 mg by mouth 2 (two) times daily.   Yes [provider]  Multiple Vitamins-Minerals (ALIVE ONCE DAILY WOMENS 50+ PO) Take by mouth.   Yes [provider]  nadolol (CORGARD) 20 MG tablet Take 20 mg by mouth daily.   Yes [provider]  pantoprazole (PROTONIX) 40 MG tablet Take 40 mg by mouth daily.   Yes [provider]  rifaximin (XIFAXAN) 200 MG tablet Take 200 mg by mouth 2 (two) times daily.   Yes [provider]  rosuvastatin (CRESTOR) 10 MG tablet Take 10 mg by mouth daily.   Yes [provider]  sertraline (ZOLOFT) 50 MG tablet Take 1.5 tablets (75 mg total) by mouth daily. 1.5 daily 01/21/17  Yes Glean Hess, MD  BD PEN NEEDLE NANO U/F 32G X 4 MM MISC Inject  1 each into the skin 4 (four) times daily. 01/09/17   [provider]  mupirocin ointment (BACTROBAN) 2 % Apply 1 application topically 3 (three) times daily. 03/20/17   Lorin Picket, PA-C    Allergies as of 03/31/2017 - Review Complete 03/25/2017  Allergen Reaction Noted  . Hydrocodone Other (See Comments) 01/21/2017    Family History  Problem Relation Age of Onset  . Breast cancer Mother   . Diabetes Mother   . Diabetes Father   . Hypertension Father   . Cancer Paternal Grandmother     Social History   Social History  . Marital status: Single    Spouse name: N/A  . Number of children: N/A  . Years of  education: N/A   Occupational History  . Not on file.   Social History Main Topics  . Smoking status: Never Smoker  . Smokeless tobacco: Never Used  . Alcohol use No  . Drug use: No  . Sexual activity: No   Other Topics Concern  . Not on file   Social History Narrative  . No narrative on file    Review of Systems: See HPI, otherwise negative ROS  Physical Exam: BP 122/78   Pulse 66   Temp (!) 97.3 F (36.3 C) (Temporal)   Ht 5\' 3"  (1.6 m)   Wt 174 lb (78.9 kg)   SpO2 98%   BMI 30.82 kg/m  General:   Alert,  pleasant and cooperative in NAD Head:  Normocephalic and atraumatic. Neck:  Supple; no masses or thyromegaly. Lungs:  Clear throughout to auscultation.    Heart:  Regular rate and rhythm. Abdomen:  Soft, nontender and nondistended. Normal bowel sounds, without guarding, and without rebound.   Neurologic:  Alert and  oriented x4;  grossly normal neurologically.  Impression/Plan: Amber Odonnell is here for an endoscopy to be performed for varcies  Risks, benefits, limitations, and alternatives regarding  endoscopy have been reviewed with the patient.  Questions have been answered.  All parties agreeable.   [name], MD  04/10/2017, 8:24 AM

## 2017-04-10 NOTE — Transfer of Care (Signed)
Immediate Anesthesia Transfer of Care Note  Patient: Amber Odonnell  Procedure(s) Performed: Procedure(s) with comments: ESOPHAGOGASTRODUODENOSCOPY (EGD) WITH PROPOFOL (N/A) - Diabetic  sleep apnea  with banding  Patient Location: PACU  Anesthesia Type: General  Level of Consciousness: awake, alert  and patient cooperative  Airway and Oxygen Therapy: Patient Spontanous Breathing and Patient connected to supplemental oxygen  Post-op Assessment: Post-op Vital signs reviewed, Patient's Cardiovascular Status Stable, Respiratory Function Stable, Patent Airway and No signs of Nausea or vomiting  Post-op Vital Signs: Reviewed and stable  Complications: No apparent anesthesia complications

## 2017-04-10 NOTE — Anesthesia Preprocedure Evaluation (Signed)
Anesthesia Evaluation  Patient identified by MRN, date of birth, ID band Patient awake    Reviewed: Allergy & Precautions, H&P , NPO status , Patient's Chart, lab work & pertinent test results  Airway Mallampati: II  TM Distance: >3 FB Neck ROM: full    Dental no notable dental hx.    Pulmonary asthma , sleep apnea ,    Pulmonary exam normal        Cardiovascular hypertension, Normal cardiovascular exam     Neuro/Psych    GI/Hepatic (+) Cirrhosis       ,   Endo/Other  diabetesHypothyroidism   Renal/GU      Musculoskeletal   Abdominal   Peds  Hematology   Anesthesia Other Findings   Reproductive/Obstetrics                             Anesthesia Physical Anesthesia Plan  ASA: III  Anesthesia Plan: General   Post-op Pain Management:    Induction:   PONV Risk Score and Plan: 3 and Propofol  Airway Management Planned:   Additional Equipment:   Intra-op Plan:   Post-operative Plan:   Informed Consent: I have reviewed the patients History and Physical, chart, labs and discussed the procedure including the risks, benefits and alternatives for the proposed anesthesia with the patient or authorized representative who has indicated his/her understanding and acceptance.     Plan Discussed with:   Anesthesia Plan Comments:         Anesthesia Quick Evaluation

## 2017-04-13 ENCOUNTER — Telehealth: Payer: Self-pay | Admitting: Gastroenterology

## 2017-04-13 NOTE — Telephone Encounter (Signed)
Spoke with pt and advised her I will send referral for liver transplant to [facility].

## 2017-04-13 NOTE — Telephone Encounter (Signed)
error 

## 2017-04-13 NOTE — Telephone Encounter (Signed)
Has questions regarding the liver transplant list. Please call

## 2017-04-15 ENCOUNTER — Telehealth: Payer: Self-pay | Admitting: Gastroenterology

## 2017-04-15 NOTE — Telephone Encounter (Signed)
Patient called and stated that her old GI doc in [place] gave her Pantoprazole 40 mg. If Dr. Allen Norris wants her to continue it she will need a rx. Called into [facility].

## 2017-04-16 ENCOUNTER — Other Ambulatory Visit: Payer: Self-pay

## 2017-04-16 MED ORDER — PANTOPRAZOLE SODIUM 40 MG PO TBEC: 40.0000 mg | DELAYED_RELEASE_TABLET | Freq: Every day | ORAL | 6 refills | Status: DC

## 2017-04-16 NOTE — Telephone Encounter (Signed)
Spoke with pt and she stated she is taking protonix daily. Will send refill to pharmacy.

## 2017-04-19 ENCOUNTER — Encounter: Payer: Self-pay | Admitting: Internal Medicine

## 2017-04-20 ENCOUNTER — Telehealth: Payer: Self-pay

## 2017-04-20 ENCOUNTER — Other Ambulatory Visit: Payer: Self-pay | Admitting: Internal Medicine

## 2017-04-20 MED ORDER — LEVOTHYROXINE SODIUM 50 MCG PO TABS: 50.0000 ug | ORAL_TABLET | Freq: Every day | ORAL | 5 refills | Status: DC

## 2017-04-20 MED ORDER — MONTELUKAST SODIUM 10 MG PO TABS: 10.0000 mg | ORAL_TABLET | Freq: Every day | ORAL | 5 refills | Status: DC

## 2017-04-20 MED ORDER — LOSARTAN POTASSIUM 25 MG PO TABS: 25.0000 mg | ORAL_TABLET | Freq: Two times a day (BID) | ORAL | 5 refills | Status: DC

## 2017-04-20 MED ORDER — LORATADINE 10 MG PO TABS: 10.0000 mg | ORAL_TABLET | Freq: Every day | ORAL | 5 refills | Status: DC

## 2017-04-20 NOTE — Telephone Encounter (Signed)
Patient pharmacy- [facility] - faxed over saying losartan maximum dose is once daily. Insurance will not approve 25 mg 1 tab twice daily.  Called left VM for pt to call office approving [place] to send over 50 mg tablets and for patient to take a half a tablet twice daily. Awaiting pt call back.

## 2017-04-20 NOTE — Telephone Encounter (Signed)
Pt My Chart Message.

## 2017-04-22 ENCOUNTER — Other Ambulatory Visit: Payer: Self-pay | Admitting: Internal Medicine

## 2017-04-22 ENCOUNTER — Telehealth: Payer: Self-pay

## 2017-04-22 MED ORDER — LOSARTAN POTASSIUM 50 MG PO TABS: 50.0000 mg | ORAL_TABLET | Freq: Every day | ORAL | 5 refills | Status: DC

## 2017-04-22 NOTE — Telephone Encounter (Signed)
I resent the Rx - 50 mg per day (may take 1/2 twice a day)

## 2017-04-22 NOTE — Telephone Encounter (Signed)
Received fax from [facility]- states LOSARTAN 25 MG TABLETS "Take 1 tablet by mouth twice daily"- insurance does not covor this for the patient. "Maximum daily dose of 1.0" please Advise. Tried contacting pt to inform but no call back with response.

## 2017-05-06 ENCOUNTER — Other Ambulatory Visit: Payer: Self-pay | Admitting: Internal Medicine

## 2017-05-13 ENCOUNTER — Ambulatory Visit (INDEPENDENT_AMBULATORY_CARE_PROVIDER_SITE_OTHER): Payer: BLUE CROSS/BLUE SHIELD | Admitting: Gastroenterology

## 2017-05-13 ENCOUNTER — Encounter: Payer: Self-pay | Admitting: Gastroenterology

## 2017-05-13 VITALS — BP 112/60 | HR 68 | Temp 98.6°F | Ht 63.0 in | Wt 179.0 lb

## 2017-05-13 DIAGNOSIS — K7469 Other cirrhosis of liver: Secondary | ICD-10-CM

## 2017-05-13 DIAGNOSIS — R140 Abdominal distension (gaseous): Secondary | ICD-10-CM

## 2017-05-13 DIAGNOSIS — R6881 Early satiety: Secondary | ICD-10-CM

## 2017-05-13 MED ORDER — LACTULOSE ENCEPHALOPATHY 10 GM/15ML PO SOLN: ORAL | 3 refills | Status: DC

## 2017-05-13 NOTE — Progress Notes (Signed)
Primary Care Physician: Glean Hess, MD  Primary Gastroenterologist:  Dr. [name]  Chief Complaint  Patient presents with  . Follow up constipation    HPI: Amber Odonnell is a 46 y.o. female here for follow-up of her cirrhosis and abdominal bloating.  The patient reports that she has epigastric pain and is still having constipation.  Despite being put on 15 mL of lactulose twice a day the patient states she has been taking a probably 5 mL of lactulose daily.  She reports that she continues to have early satiety and bloating with a distended abdomen.  Current Outpatient Prescriptions  Medication Sig Dispense Refill  . B Complex Vitamins (B-COMPLEX/B-12 SL) Place under the tongue every morning.    . BD PEN NEEDLE NANO U/F 32G X 4 MM MISC Inject 1 each into the skin 4 (four) times daily.  7  . diclofenac (VOLTAREN) 75 MG EC tablet Take 75 mg by mouth 2 (two) times daily.    . empagliflozin (JARDIANCE) 25 MG TABS tablet Take 25 mg by mouth daily. 30 tablet 5  . furosemide (LASIX) 40 MG tablet Take 1 tablet (40 mg total) by mouth daily. 30 tablet 11  . GENERLAC 10 GM/15ML SOLN TK 15 ML PO BID  0  . insulin aspart (NOVOLOG FLEXPEN) 100 UNIT/ML FlexPen Inject 60 Units into the skin 3 (three) times daily with meals.     . Insulin Degludec (TRESIBA FLEXTOUCH [place]) Inject 80 Units into the skin daily.    Marland Kitchen lactulose, encephalopathy, (GENERLAC) 10 GM/15ML SOLN TAKE 15 ML BY MOUTH TWICE DAILY AS NEEDED FOR MILD CONSTIPATION 946 mL 3  . levothyroxine (SYNTHROID, LEVOTHROID) 50 MCG tablet Take 1 tablet (50 mcg total) by mouth daily before breakfast. 30 tablet 5  . loratadine (CLARITIN) 10 MG tablet Take 1 tablet (10 mg total) by mouth daily. 30 tablet 5  . losartan (COZAAR) 50 MG tablet Take 1 tablet (50 mg total) by mouth daily. May take 1/2 twice a day 30 tablet 5  . MEGARED OMEGA-3 KRILL OIL PO Take by mouth.    . metFORMIN (GLUCOPHAGE-XR) 500 MG 24 hr tablet Take 1,000 mg by mouth 2  (two) times daily.    . montelukast (SINGULAIR) 10 MG tablet Take 1 tablet (10 mg total) by mouth at bedtime. 30 tablet 5  . Multiple Vitamins-Minerals (ALIVE ONCE DAILY WOMENS 50+ PO) Take by mouth.    . mupirocin ointment (BACTROBAN) 2 % Apply 1 application topically 3 (three) times daily. 22 g 0  . nadolol (CORGARD) 20 MG tablet Take 20 mg by mouth daily.    . pantoprazole (PROTONIX) 40 MG tablet Take 1 tablet (40 mg total) by mouth daily. 30 tablet 6  . rifaximin (XIFAXAN) 200 MG tablet Take 200 mg by mouth 2 (two) times daily.    . rosuvastatin (CRESTOR) 10 MG tablet Take 10 mg by mouth daily.    . sertraline (ZOLOFT) 50 MG tablet TAKE 1 AND 1/2 TABLETS(75 MG) BY MOUTH DAILY 45 tablet 0   No current facility-administered medications for this visit.     Allergies as of 05/13/2017 - Review Complete 05/13/2017  Allergen Reaction Noted  . Hydrocodone Other (See Comments) 01/21/2017    ROS:  General: Negative for anorexia, weight loss, fever, chills, fatigue, weakness. ENT: Negative for hoarseness, difficulty swallowing , nasal congestion. CV: Negative for chest pain, angina, palpitations, dyspnea on exertion, peripheral edema.  Respiratory: Negative for dyspnea at rest, dyspnea on exertion, cough,  sputum, wheezing.  GI: See history of present illness. GU:  Negative for dysuria, hematuria, urinary incontinence, urinary frequency, nocturnal urination.  Endo: Negative for unusual weight change.    Physical Examination:   BP 112/60   Pulse 68   Temp 98.6 F (37 C) (Oral)   Ht 5\' 3"  (1.6 m)   Wt 179 lb (81.2 kg)   BMI 31.71 kg/m   General: Well-nourished, well-developed in no acute distress.  Eyes: No icterus. Conjunctivae pink. Mouth: Oropharyngeal mucosa moist and pink , no lesions erythema or exudate. Lungs: Clear to auscultation bilaterally. Non-labored. Heart: Regular rate and rhythm, no murmurs rubs or gallops.  Abdomen: Bowel sounds are normal, nontender, nondistended,  no hepatosplenomegaly or masses, no abdominal bruits or hernia , no rebound or guarding.   Extremities: No lower extremity edema. No clubbing or deformities. Neuro: Alert and oriented x 3.  Grossly intact. Skin: Warm and dry, no jaundice.   Psych: Alert and cooperative, normal mood and affect.  Labs:    Imaging Studies: No results found.  Assessment and Plan:   Louis Gaw is a 46 y.o. y/o female Who has a history of cirrhosis and has had esophageal banding of her varices.  The patient now comes in with early satiety and bloating with continued constipation.  The patient has been told that she has not been taking her lactulose properly and should go up to 15 mL per day.  If that does not help she has been told to go to 15 mL twice a day. The patient does not need an upper endoscopy at this time for her early satiety since she has had 2 EGDs recently.  The patient has been explained the plan and will contact me if her symptoms do not improve.    [name], MD. Marval Regal   Note: This dictation was prepared with Dragon dictation along with smaller phrase technology. Any transcriptional errors that result from this process are unintentional.

## 2017-05-20 ENCOUNTER — Encounter: Payer: Self-pay | Admitting: Internal Medicine

## 2017-05-20 NOTE — Telephone Encounter (Signed)
MyChart msg

## 2017-06-04 ENCOUNTER — Other Ambulatory Visit: Payer: Self-pay | Admitting: Internal Medicine

## 2017-06-09 ENCOUNTER — Other Ambulatory Visit: Payer: Self-pay | Admitting: Internal Medicine

## 2017-06-09 ENCOUNTER — Telehealth: Payer: Self-pay

## 2017-06-09 MED ORDER — INSULIN ASPART 100 UNIT/ML FLEXPEN: 60.0000 [IU] | PEN_INJECTOR | Freq: Three times a day (TID) | SUBCUTANEOUS | 5 refills | Status: AC

## 2017-06-09 NOTE — Telephone Encounter (Signed)
Patient requesting refill on Novolog Flexpen Injection 3ML (orange) through fax from [place]. Please Advise.

## 2017-06-10 DIAGNOSIS — I8500 Esophageal varices without bleeding: Secondary | ICD-10-CM | POA: Diagnosis not present

## 2017-06-10 DIAGNOSIS — R1011 Right upper quadrant pain: Secondary | ICD-10-CM | POA: Diagnosis not present

## 2017-06-10 DIAGNOSIS — M1710 Unilateral primary osteoarthritis, unspecified knee: Secondary | ICD-10-CM | POA: Diagnosis not present

## 2017-06-10 DIAGNOSIS — R188 Other ascites: Secondary | ICD-10-CM | POA: Diagnosis not present

## 2017-06-10 DIAGNOSIS — E1165 Type 2 diabetes mellitus with hyperglycemia: Secondary | ICD-10-CM | POA: Diagnosis not present

## 2017-06-10 DIAGNOSIS — I10 Essential (primary) hypertension: Secondary | ICD-10-CM | POA: Diagnosis not present

## 2017-06-10 DIAGNOSIS — K581 Irritable bowel syndrome with constipation: Secondary | ICD-10-CM | POA: Diagnosis not present

## 2017-06-10 DIAGNOSIS — K7581 Nonalcoholic steatohepatitis (NASH): Secondary | ICD-10-CM | POA: Diagnosis not present

## 2017-06-10 DIAGNOSIS — E785 Hyperlipidemia, unspecified: Secondary | ICD-10-CM | POA: Diagnosis not present

## 2017-06-10 DIAGNOSIS — R140 Abdominal distension (gaseous): Secondary | ICD-10-CM | POA: Diagnosis not present

## 2017-06-10 DIAGNOSIS — K7460 Unspecified cirrhosis of liver: Secondary | ICD-10-CM | POA: Diagnosis not present

## 2017-06-10 DIAGNOSIS — Z6831 Body mass index (BMI) 31.0-31.9, adult: Secondary | ICD-10-CM | POA: Diagnosis not present

## 2017-06-10 DIAGNOSIS — R5383 Other fatigue: Secondary | ICD-10-CM | POA: Diagnosis not present

## 2017-06-10 DIAGNOSIS — E039 Hypothyroidism, unspecified: Secondary | ICD-10-CM | POA: Diagnosis not present

## 2017-06-10 DIAGNOSIS — E669 Obesity, unspecified: Secondary | ICD-10-CM | POA: Diagnosis not present

## 2017-06-10 DIAGNOSIS — R1013 Epigastric pain: Secondary | ICD-10-CM | POA: Diagnosis not present

## 2017-06-15 DIAGNOSIS — K7460 Unspecified cirrhosis of liver: Secondary | ICD-10-CM | POA: Diagnosis not present

## 2017-06-15 DIAGNOSIS — E1165 Type 2 diabetes mellitus with hyperglycemia: Secondary | ICD-10-CM | POA: Diagnosis not present

## 2017-06-15 DIAGNOSIS — K760 Fatty (change of) liver, not elsewhere classified: Secondary | ICD-10-CM | POA: Diagnosis not present

## 2017-06-15 DIAGNOSIS — Z794 Long term (current) use of insulin: Secondary | ICD-10-CM | POA: Diagnosis not present

## 2017-06-30 ENCOUNTER — Encounter: Payer: Self-pay | Admitting: Gastroenterology

## 2017-07-03 ENCOUNTER — Encounter: Payer: Self-pay | Admitting: Internal Medicine

## 2017-07-03 ENCOUNTER — Other Ambulatory Visit: Payer: Self-pay

## 2017-07-03 ENCOUNTER — Telehealth: Payer: Self-pay | Admitting: Gastroenterology

## 2017-07-03 NOTE — Telephone Encounter (Signed)
Patient msg.

## 2017-07-03 NOTE — Telephone Encounter (Signed)
Patient Amber Odonnell she needs a prescription called into [facility] in [place] for Hep A shot. The doctor at [facility] that Dr. Allen Norris referred her to wants that to be done.

## 2017-07-05 ENCOUNTER — Emergency Department: Payer: BLUE CROSS/BLUE SHIELD

## 2017-07-05 ENCOUNTER — Emergency Department
Admission: EM | Admit: 2017-07-05 | Discharge: 2017-07-05 | Disposition: A | Discharge status: Home / Self Care | Payer: BLUE CROSS/BLUE SHIELD | Attending: Emergency Medicine | Admitting: Emergency Medicine

## 2017-07-05 DIAGNOSIS — Z794 Long term (current) use of insulin: Secondary | ICD-10-CM | POA: Insufficient documentation

## 2017-07-05 DIAGNOSIS — I10 Essential (primary) hypertension: Secondary | ICD-10-CM | POA: Diagnosis not present

## 2017-07-05 DIAGNOSIS — E119 Type 2 diabetes mellitus without complications: Secondary | ICD-10-CM | POA: Insufficient documentation

## 2017-07-05 DIAGNOSIS — J45909 Unspecified asthma, uncomplicated: Secondary | ICD-10-CM | POA: Diagnosis not present

## 2017-07-05 DIAGNOSIS — E039 Hypothyroidism, unspecified: Secondary | ICD-10-CM | POA: Insufficient documentation

## 2017-07-05 DIAGNOSIS — Z79899 Other long term (current) drug therapy: Secondary | ICD-10-CM | POA: Diagnosis not present

## 2017-07-05 DIAGNOSIS — R51 Headache: Secondary | ICD-10-CM | POA: Diagnosis not present

## 2017-07-05 DIAGNOSIS — G4489 Other headache syndrome: Secondary | ICD-10-CM | POA: Diagnosis not present

## 2017-07-05 LAB — HEPATIC FUNCTION PANEL
ALBUMIN: 3.6 g/dL (ref 3.5–5.0)
ALK PHOS: 158 U/L — AB (ref 38–126)
ALT: 51 U/L (ref 14–54)
AST: 84 U/L — AB (ref 15–41)
BILIRUBIN DIRECT: 0.6 mg/dL — AB (ref 0.1–0.5)
BILIRUBIN TOTAL: 2 mg/dL — AB (ref 0.3–1.2)
Indirect Bilirubin: 1.4 mg/dL — ABNORMAL HIGH (ref 0.3–0.9)
Total Protein: 7.6 g/dL (ref 6.5–8.1)

## 2017-07-05 LAB — CSF CELL COUNT WITH DIFFERENTIAL
EOS CSF: 0 %
Eosinophils, CSF: 0 %
Lymphs, CSF: 0 %
Lymphs, CSF: 0 %
Monocyte-Macrophage-Spinal Fluid: 0 %
Monocyte-Macrophage-Spinal Fluid: 0 %
Other Cells, CSF: 0
Other Cells, CSF: 0
RBC Count, CSF: 34 /mm3 — ABNORMAL HIGH (ref 0–3)
RBC Count, CSF: 47 /mm3 — ABNORMAL HIGH (ref 0–3)
Segmented Neutrophils-CSF: 0 %
Segmented Neutrophils-CSF: 0 %
TUBE #: 1
TUBE #: 4
WBC CSF: 0 /mm3 (ref 0–5)
WBC, CSF: 0 /mm3 (ref 0–5)

## 2017-07-05 LAB — CBC WITH DIFFERENTIAL/PLATELET
Basophils Absolute: 0.1 10*3/uL (ref 0–0.1)
Basophils Relative: 1 %
EOS ABS: 0.3 10*3/uL (ref 0–0.7)
Eosinophils Relative: 2 %
HEMATOCRIT: 39.4 % (ref 35.0–47.0)
HEMOGLOBIN: 12.2 g/dL (ref 12.0–16.0)
LYMPHS ABS: 2.1 10*3/uL (ref 1.0–3.6)
LYMPHS PCT: 16 %
MCH: 24.5 pg — AB (ref 26.0–34.0)
MCHC: 30.8 g/dL — ABNORMAL LOW (ref 32.0–36.0)
MCV: 79.4 fL — ABNORMAL LOW (ref 80.0–100.0)
Monocytes Absolute: 1.7 10*3/uL — ABNORMAL HIGH (ref 0.2–0.9)
Monocytes Relative: 13 %
NEUTROS ABS: 9.1 10*3/uL — AB (ref 1.4–6.5)
NEUTROS PCT: 68 %
Platelets: 155 10*3/uL (ref 150–440)
RBC: 4.96 MIL/uL (ref 3.80–5.20)
RDW: 18.3 % — ABNORMAL HIGH (ref 11.5–14.5)
WBC: 13.3 10*3/uL — AB (ref 3.6–11.0)

## 2017-07-05 LAB — BASIC METABOLIC PANEL
Anion gap: 14 (ref 5–15)
BUN: 12 mg/dL (ref 6–20)
CALCIUM: 8.7 mg/dL — AB (ref 8.9–10.3)
CHLORIDE: 104 mmol/L (ref 101–111)
CO2: 22 mmol/L (ref 22–32)
Creatinine, Ser: 0.92 mg/dL (ref 0.44–1.00)
GFR calc non Af Amer: >60 mL/min (ref >60)
GLUCOSE: 184 mg/dL — AB (ref 65–99)
Potassium: 3.8 mmol/L (ref 3.5–5.1)
SODIUM: 140 mmol/L (ref 135–145)

## 2017-07-05 LAB — PROTEIN AND GLUCOSE, CSF
Glucose, CSF: 85 mg/dL — ABNORMAL HIGH (ref 40–70)
Total  Protein, CSF: 39 mg/dL (ref 15–45)

## 2017-07-05 LAB — PROTIME-INR
INR: 1.29
Prothrombin Time: 16 seconds — ABNORMAL HIGH (ref 11.4–15.2)

## 2017-07-05 MED ORDER — SODIUM CHLORIDE 0.9 % IV BOLUS (SEPSIS)
1000.0000 mL | Freq: Once | INTRAVENOUS | Status: AC
  Administered 2017-07-05: 1000 mL via INTRAVENOUS

## 2017-07-05 MED ORDER — LIDOCAINE HCL (PF) 1 % IJ SOLN
INTRAMUSCULAR | Status: AC
  Administered 2017-07-05: 5 mL
  Filled 2017-07-05: qty 5

## 2017-07-05 MED ORDER — FENTANYL CITRATE (PF) 100 MCG/2ML IJ SOLN
50.0000 ug | Freq: Once | INTRAMUSCULAR | Status: AC
  Administered 2017-07-05: 50 ug via INTRAVENOUS
  Filled 2017-07-05: qty 2

## 2017-07-05 MED ORDER — LIDOCAINE HCL (PF) 1 % IJ SOLN
5.0000 mL | Freq: Once | INTRAMUSCULAR | Status: AC
  Administered 2017-07-05: 5 mL

## 2017-07-05 MED ORDER — PROCHLORPERAZINE EDISYLATE 5 MG/ML IJ SOLN
10.0000 mg | Freq: Once | INTRAMUSCULAR | Status: AC
  Administered 2017-07-05: 10 mg via INTRAVENOUS
  Filled 2017-07-05: qty 2

## 2017-07-05 MED ORDER — BUTALBITAL-APAP-CAFFEINE 50-325-40 MG PO TABS: 1.0000 | ORAL_TABLET | Freq: Four times a day (QID) | ORAL | 0 refills | Status: DC | PRN

## 2017-07-05 MED ORDER — IBUPROFEN 600 MG PO TABS: 600.0000 mg | ORAL_TABLET | Freq: Three times a day (TID) | ORAL | 0 refills | Status: DC | PRN

## 2017-07-05 MED ORDER — DIPHENHYDRAMINE HCL 50 MG/ML IJ SOLN
50.0000 mg | Freq: Once | INTRAMUSCULAR | Status: AC
  Administered 2017-07-05: 50 mg via INTRAVENOUS
  Filled 2017-07-05: qty 1

## 2017-07-05 MED ORDER — KETOROLAC TROMETHAMINE 30 MG/ML IJ SOLN
15.0000 mg | Freq: Once | INTRAMUSCULAR | Status: AC
  Administered 2017-07-05: 15 mg via INTRAVENOUS
  Filled 2017-07-05: qty 1

## 2017-07-05 NOTE — ED Triage Notes (Signed)
Patient arrives to [facility] ED with c/o migraine for 2 days. No prior history.

## 2017-07-05 NOTE — ED Provider Notes (Signed)
[facility] Emergency Department Provider Note  ____________________________________________   First MD Initiated Contact with Patient 07/05/17 1229     (approximate)  I have reviewed the triage vital signs and the nursing notes.   HISTORY  Chief Complaint Migraine    HPI Amber Odonnell is a 46 y.o. female who self presents to the emergency Department with roughly 26 hours of sudden onset maximal onset severe bifrontal throbbing headache unlike any headache that she has ever had before.she reports photophobia and nausea and vomiting. She denies numbness or weakness. She denies trauma. She denies fevers or chills. She's tried ibuprofen which has not helped her pain. Nothing in particular makes it worse.   Past Medical History:  Diagnosis Date  . Arthritis    of knee  . Asthma   . Chronic gastritis   . Cirrhosis of liver without ascites ([place])   . Diabetes mellitus without complication ([place])    type 2  . Dyspnea   . Generalized anxiety disorder   . GERD (gastroesophageal reflux disease)   . Hyperlipidemia   . Hypertension   . Hypothyroidism   . Irritable bowel syndrome (IBS)    w/ constipation  . Obesity (BMI 30-39.9)   . Portal hypertension ([facility])   . Sleep apnea    CPAP  . Splenomegaly   . Thrombocytopenia [facility])     Patient Active Problem List   Diagnosis Date Noted  . Secondary esophageal varices with bleeding ([place])   . Vaginal flatus 03/13/2017  . Eye discharge 03/13/2017  . Esophageal varices determined by endoscopy ([place]) 03/11/2017  . Abnormal feces   . Polyp of sigmoid colon   . Gastritis without bleeding   . Environmental and seasonal allergies 01/21/2017  . Type 2 diabetes mellitus without complication, with long-term current use of insulin ([place]) 01/21/2017  . Hypothyroidism due to acquired atrophy of thyroid 01/21/2017  . Hyperlipidemia associated with type 2 diabetes mellitus ([place]) 01/21/2017  . Irritable bowel syndrome with  constipation 01/21/2017  . Obstructive sleep apnea syndrome 01/21/2017  . Thrombocytopenia ([place])   . Splenomegaly   . Portal hypertension ([place])   . Obesity (BMI 30-39.9)   . Essential hypertension   . Hyperlipidemia   . Generalized anxiety disorder   . Hepatic cirrhosis ([place])   . Chronic gastritis   . Arthritis     Past Surgical History:  Procedure Laterality Date  . ESOPHAGOGASTRODUODENOSCOPY (EGD) WITH PROPOFOL N/A 02/26/2017   Procedure: ESOPHAGOGASTRODUODENOSCOPY (EGD) WITH Banding;  Surgeon: [name], MD;  Location: [place];  Service: Endoscopy;  Laterality: N/A;  . ESOPHAGOGASTRODUODENOSCOPY (EGD) WITH PROPOFOL N/A 04/10/2017   Procedure: ESOPHAGOGASTRODUODENOSCOPY (EGD) WITH PROPOFOL;  Surgeon: [name], MD;  Location: [place];  Service: Endoscopy;  Laterality: N/A;  Diabetic  sleep apnea  with banding  . TONSILLECTOMY    . TOTAL VAGINAL HYSTERECTOMY     endometriosis    Prior to Admission medications   Medication Sig Start Date End Date Taking? Authorizing Provider  B Complex Vitamins (B-COMPLEX/B-12 SL) Place under the tongue every morning.    [provider]  BD PEN NEEDLE NANO U/F 32G X 4 MM MISC Inject 1 each into the skin 4 (four) times daily. 01/09/17   [provider]  butalbital-acetaminophen-caffeine Emelda Brothers, ESGIC) 62-376-28 MG tablet Take 1-2 tablets by mouth every 6 (six) hours as needed for headache. 07/05/17 07/05/18  Darel Hong, MD  diclofenac (VOLTAREN) 75 MG EC tablet Take 75 mg by mouth 2 (  two) times daily.    [provider]  empagliflozin (JARDIANCE) 25 MG TABS tablet Take 25 mg by mouth daily. 01/22/17   Glean Hess, MD  furosemide (LASIX) 40 MG tablet Take 1 tablet (40 mg total) by mouth daily. 04/10/17 04/10/18  [name], MD  GENERLAC 10 GM/15ML SOLN TK 15 ML PO BID 02/18/17   [provider]  ibuprofen (ADVIL,MOTRIN) 600 MG tablet Take 1 tablet (600 mg total) by mouth every 8  (eight) hours as needed. 07/05/17   Darel Hong, MD  insulin aspart (NOVOLOG FLEXPEN) 100 UNIT/ML FlexPen Inject 60 Units into the skin 3 (three) times daily with meals. 06/09/17   Glean Hess, MD  Insulin Degludec (TRESIBA FLEXTOUCH [place]) Inject 80 Units into the skin daily.    [provider]  lactulose, encephalopathy, (GENERLAC) 10 GM/15ML SOLN TAKE 15 ML BY MOUTH TWICE DAILY AS NEEDED FOR MILD CONSTIPATION 05/13/17   [name], MD  levothyroxine (SYNTHROID, LEVOTHROID) 50 MCG tablet Take 1 tablet (50 mcg total) by mouth daily before breakfast. 04/20/17   Glean Hess, MD  loratadine (CLARITIN) 10 MG tablet Take 1 tablet (10 mg total) by mouth daily. 04/20/17   Glean Hess, MD  losartan (COZAAR) 50 MG tablet Take 1 tablet (50 mg total) by mouth daily. May take 1/2 twice a day 04/22/17   Glean Hess, MD  [facility] OMEGA-3 KRILL OIL PO Take by mouth.    [provider]  metFORMIN (GLUCOPHAGE-XR) 500 MG 24 hr tablet Take 1,000 mg by mouth 2 (two) times daily.    [provider]  montelukast (SINGULAIR) 10 MG tablet Take 1 tablet (10 mg total) by mouth at bedtime. 04/20/17   Glean Hess, MD  Multiple Vitamins-Minerals (ALIVE ONCE DAILY WOMENS 50+ PO) Take by mouth.    [provider]  mupirocin ointment (BACTROBAN) 2 % Apply 1 application topically 3 (three) times daily. 03/20/17   Lorin Picket, PA-C  nadolol (CORGARD) 20 MG tablet Take 20 mg by mouth daily.    [provider]  pantoprazole (PROTONIX) 40 MG tablet Take 1 tablet (40 mg total) by mouth daily. 04/16/17   [name], MD  rifaximin (XIFAXAN) 200 MG tablet Take 200 mg by mouth 2 (two) times daily.    [provider]  rosuvastatin (CRESTOR) 10 MG tablet Take 10 mg by mouth daily.    [provider]  sertraline (ZOLOFT) 50 MG tablet TAKE 1 AND 1/2 TABLETS(75 MG) BY MOUTH DAILY 06/05/17   Glean Hess, MD    Allergies Hydrocodone  Family  History  Problem Relation Age of Onset  . Breast cancer Mother   . Diabetes Mother   . Diabetes Father   . Hypertension Father   . Cancer Paternal Grandmother     Social History Social History  Substance Use Topics  . Smoking status: Never Smoker  . Smokeless tobacco: Never Used  . Alcohol use No    Review of Systems Constitutional: No fever/chills Eyes: positive for visual changes. ENT: No sore throat. Cardiovascular: Denies chest pain. Respiratory: Denies shortness of breath. Gastrointestinal: No abdominal pain.  positive for nausea, positive for vomiting.  No diarrhea.  No constipation. Genitourinary: Negative for dysuria. Musculoskeletal: Negative for back pain. Skin: Negative for rash. Neurological: positive for headache   ____________________________________________   PHYSICAL EXAM:  VITAL SIGNS: ED Triage Vitals  Enc Vitals Group     BP 07/05/17 1216 (!) 102/56  Pulse Rate 07/05/17 1216 (!) 110     Resp 07/05/17 1216 16     Temp 07/05/17 1216 98.3 F (36.8 C)     Temp Source 07/05/17 1216 Oral     SpO2 07/05/17 1216 100 %     Weight --      Height 07/05/17 1214 5\' 3"  (1.6 m)     Head Circumference --      Peak Flow --      Pain Score 07/05/17 1214 10     Pain Loc --      Pain Edu? --      Excl. in [place]? --     Constitutional: alert and oriented 4 appears extremely uncomfortable holding her head in her hands nauseated and retching Eyes: PERRL EOMI. pupils midrange and brisk Head: Atraumatic. Nose: No congestion/rhinnorhea. Mouth/Throat: No trismus Neck: No stridor.  no meningismus Cardiovascular: Normal rate, regular rhythm. Grossly normal heart sounds.  Good peripheral circulation. Respiratory: Normal respiratory effort.  No retractions. Lungs CTAB and moving good air Gastrointestinal: soft nontender Musculoskeletal: No lower extremity edema   Neurologic:  Normal speech and language. No gross focal neurologic deficits are appreciated. Skin:   Skin is warm, dry and intact. No rash noted. Psychiatric: Mood and affect are normal. Speech and behavior are normal.    ____________________________________________   DIFFERENTIAL includes but not limited to  migraine headache, tension headache, subarachnoid hemorrhage, meningitis, temporal arteritis, cervical artery dissection ____________________________________________   LABS (all labs ordered are listed, but only abnormal results are displayed)  Labs Reviewed  BASIC METABOLIC PANEL - Abnormal; Notable for the following:       Result Value   Glucose, Bld 184 (*)    Calcium 8.7 (*)    All other components within normal limits  HEPATIC FUNCTION PANEL - Abnormal; Notable for the following:    AST 84 (*)    Alkaline Phosphatase 158 (*)    Total Bilirubin 2.0 (*)    Bilirubin, Direct 0.6 (*)    Indirect Bilirubin 1.4 (*)    All other components within normal limits  PROTIME-INR - Abnormal; Notable for the following:    Prothrombin Time 16.0 (*)    All other components within normal limits  CBC WITH DIFFERENTIAL/PLATELET - Abnormal; Notable for the following:    WBC 13.3 (*)    MCV 79.4 (*)    MCH 24.5 (*)    MCHC 30.8 (*)    RDW 18.3 (*)    Neutro Abs 9.1 (*)    Monocytes Absolute 1.7 (*)    All other components within normal limits  CSF CELL COUNT WITH DIFFERENTIAL - Abnormal; Notable for the following:    Appearance, CSF CLEAR (*)    RBC Count, CSF 47 (*)    All other components within normal limits  CSF CELL COUNT WITH DIFFERENTIAL - Abnormal; Notable for the following:    Appearance, CSF CLEAR (*)    RBC Count, CSF 34 (*)    All other components within normal limits  PROTEIN AND GLUCOSE, CSF - Abnormal; Notable for the following:    Glucose, CSF 85 (*)    All other components within normal limits  CSF CULTURE    blood work reviewed and interpreted by me shows no evidence of meningitis and no evidence of subarachnoid  hemorrhage. __________________________________________  EKG   ____________________________________________  RADIOLOGY  head CT reviewed by me shows no acute disease ____________________________________________   PROCEDURES  Procedure(s) performed: yes  LUMBAR PUNCTURE  Date/Time: 07/05/2017 at 2:31 PM Performed by: Darel Hong  Consent: Verbal consent obtained.  Risks and benefits: risks, benefits and alternatives were discussed Consent given by: the patient herself Patient understanding: patient states understanding of the procedure being performed  Patient consent: the patient's understanding of the procedure matches consent given  Procedure consent: procedure consent matches procedure scheduled  Relevant documents: relevant documents present and verified  Test results: test results available and properly labeled Site marked: the operative site was marked Imaging studies: imaging studies available  Required items: required blood products, implants, devices, and special equipment available  Patient identity confirmed: verbally with patient and arm band  Time out: Immediately prior to procedure a "time out" was called to verify the correct patient, procedure, equipment, support staff and site/side marked as required.  Indications: rule out subarachnoid hemorrhage Anesthesia: local infiltration Local anesthetic: lidocaine 1% without epinephrine Anesthetic total: 2 ml Patient sedated: not required Analgesia: Compazine and diphenhydramine Preparation: Patient was prepped and draped in the usual sterile fashion. Lumbar space: L3-L4 interspace Patient's position: right lateral decubitus Needle gauge: 20 Needle length: 3.5 in Number of attempts: 2 Opening pressure: not measured but not particularly brisk Fluid appearance: crystal clear Tubes of fluid: 4 Total volume: 8 ml Post-procedure: site cleaned and adhesive bandage applied Patient tolerance: Patient tolerated  the procedure well with no immediate complications   Procedures  Critical Care performed: no  Observation: no ____________________________________________   INITIAL IMPRESSION / ASSESSMENT AND PLAN / ED COURSE  Pertinent labs & imaging results that were available during my care of the patient were reviewed by me and considered in my medical decision making (see chart for details).  The patient arrives neurologically intact although with the thunderclap headache unlike any headache she's ever had before. We discussed CT angiogram versus CT scan of the lumbar puncture and the patient preferred the lumbar puncture which I agree with. Lumbar puncture performed showing clear CSF with no evidence of meningitis or subarachnoid hemorrhage. After symptomatically treatment the patient feels remarkably improved. She is discharged home with primary care follow-up and she verbalizes understanding and agreement with plan.      ____________________________________________   FINAL CLINICAL IMPRESSION(S) / ED DIAGNOSES  Final diagnoses:  Other headache syndrome      NEW MEDICATIONS STARTED DURING THIS VISIT:  Discharge Medication List as of 07/05/2017  3:58 PM    START taking these medications   Details  butalbital-acetaminophen-caffeine (FIORICET, ESGIC) 50-325-40 MG tablet Take 1-2 tablets by mouth every 6 (six) hours as needed for headache., Starting Sun 07/05/2017, Until Mon 07/05/2018, Print    ibuprofen (ADVIL,MOTRIN) 600 MG tablet Take 1 tablet (600 mg total) by mouth every 8 (eight) hours as needed., Starting Sun 07/05/2017, Print         Note:  This document was prepared using Dragon voice recognition software and may include unintentional dictation errors.     Darel Hong, MD 07/05/17 2202

## 2017-07-05 NOTE — Discharge Instructions (Signed)
Fortunately today your blood work, your CT scan, and her lumbar puncture were all very normal. Please make an appointment to follow-up with your primary care physician this week for reevaluation and further discussion regarding her headaches. Return to the emergency department sooner for any concerns whatsoever.  It was a pleasure to take care of you today, and thank you for coming to our emergency department.  If you have any questions or concerns before leaving please ask the nurse to grab me and I'm more than happy to go through your aftercare instructions again.  If you were prescribed any opioid pain medication today such as Norco, Vicodin, Percocet, morphine, hydrocodone, or oxycodone please make sure you do not drive when you are taking this medication as it can alter your ability to drive safely.  If you have any concerns once you are home that you are not improving or are in fact getting worse before you can make it to your follow-up appointment, please do not hesitate to call [phone] and come back for further evaluation.  Darel Hong, MD  Results for orders placed or performed during the hospital encounter of 07/05/17  CSF culture  Result Value Ref Range   Specimen Description LP    Special Requests NONE    Gram Stain NO ORGANISMS SEEN RARE WBC MOD RBC     Culture PENDING    Report Status PENDING   Basic metabolic panel  Result Value Ref Range   Sodium 140 135 - 145 mmol/L   Potassium 3.8 3.5 - 5.1 mmol/L   Chloride 104 101 - 111 mmol/L   CO2 22 22 - 32 mmol/L   Glucose, Bld 184 (H) 65 - 99 mg/dL   BUN 12 6 - 20 mg/dL   Creatinine, Ser 0.92 0.44 - 1.00 mg/dL   Calcium 8.7 (L) 8.9 - 10.3 mg/dL   GFR calc non Af Amer >60 >60 mL/min   GFR calc Af Amer >60 >60 mL/min   Anion gap 14 5 - 15  Hepatic function panel  Result Value Ref Range   Total Protein 7.6 6.5 - 8.1 g/dL   Albumin 3.6 3.5 - 5.0 g/dL   AST 84 (H) 15 - 41 U/L   ALT 51 14 - 54 U/L   Alkaline Phosphatase 158  (H) 38 - 126 U/L   Total Bilirubin 2.0 (H) 0.3 - 1.2 mg/dL   Bilirubin, Direct 0.6 (H) 0.1 - 0.5 mg/dL   Indirect Bilirubin 1.4 (H) 0.3 - 0.9 mg/dL  Protime-INR  Result Value Ref Range   Prothrombin Time 16.0 (H) 11.4 - 15.2 seconds   INR 1.29   CBC with Differential  Result Value Ref Range   WBC 13.3 (H) 3.6 - 11.0 K/uL   RBC 4.96 3.80 - 5.20 MIL/uL   Hemoglobin 12.2 12.0 - 16.0 g/dL   HCT 39.4 35.0 - 47.0 %   MCV 79.4 (L) 80.0 - 100.0 fL   MCH 24.5 (L) 26.0 - 34.0 pg   MCHC 30.8 (L) 32.0 - 36.0 g/dL   RDW 18.3 (H) 11.5 - 14.5 %   Platelets 155 150 - 440 K/uL   Neutrophils Relative % 68 %   Neutro Abs 9.1 (H) 1.4 - 6.5 K/uL   Lymphocytes Relative 16 %   Lymphs Abs 2.1 1.0 - 3.6 K/uL   Monocytes Relative 13 %   Monocytes Absolute 1.7 (H) 0.2 - 0.9 K/uL   Eosinophils Relative 2 %   Eosinophils Absolute 0.3 0 - 0.7 K/uL  Basophils Relative 1 %   Basophils Absolute 0.1 0 - 0.1 K/uL  CSF cell count with differential collection tube #: 1  Result Value Ref Range   Tube # 1    Color, CSF COLORLESS COLORLESS   Appearance, CSF CLEAR (A) CLEAR   Supernatant CLEAR    RBC Count, CSF 47 (H) 0 - 3 /cu mm   WBC, CSF 0 0 - 5 /cu mm   Segmented Neutrophils-CSF 0 %   Lymphs, CSF 0 %   Monocyte-Macrophage-Spinal Fluid 0 %   Eosinophils, CSF 0 %   Other Cells, CSF 0   CSF cell count with differential collection tube #: 4  Result Value Ref Range   Tube # 4    Color, CSF COLORLESS COLORLESS   Appearance, CSF CLEAR (A) CLEAR   Supernatant CLEAR    RBC Count, CSF 34 (H) 0 - 3 /cu mm   WBC, CSF 0 0 - 5 /cu mm   Segmented Neutrophils-CSF 0 %   Lymphs, CSF 0 %   Monocyte-Macrophage-Spinal Fluid 0 %   Eosinophils, CSF 0 %   Other Cells, CSF 0   Protein and glucose, CSF  Result Value Ref Range   Glucose, CSF 85 (H) 40 - 70 mg/dL   Total  Protein, CSF 39 15 - 45 mg/dL   Ct Head Wo Contrast  Result Date: 07/05/2017 CLINICAL DATA:  Frontal migraine headache for 2 days. No acute injury.  EXAM: CT HEAD WITHOUT CONTRAST TECHNIQUE: Contiguous axial images were obtained from the base of the skull through the vertex without intravenous contrast. COMPARISON:  None. FINDINGS: Brain: There is no evidence of acute intracranial hemorrhage, mass lesion, brain edema or extra-axial fluid collection. The ventricles and subarachnoid spaces are appropriately sized for age. There is no CT evidence of acute cortical infarction. The pituitary gland is prominent, measuring 11 mm in height. A mildly prominent cisterna magna noted. Vascular: No hyperdense vessel identified. Skull: Negative for fracture or focal lesion. Sinuses/Orbits: Mild mucosal thickening in the left division of the sphenoid sinus. The visualized paranasal sinuses and mastoid air cells are otherwise clear. No orbital abnormalities are seen. Other: None. IMPRESSION: 1. No acute intracranial findings identified. 2. Prominent pituitary for age (assuming non pregnancy). Recommend correlation with endocrine function tests and 6-12 month follow-up pituitary MRI. This recommendation follows ACR consensus guidelines: White Paper of the ACR Incidental Findings Committee. Electronically Signed   By: Richardean Sale M.D.   On: 07/05/2017 13:54

## 2017-07-06 ENCOUNTER — Other Ambulatory Visit: Payer: Self-pay

## 2017-07-06 MED ORDER — HEPATITIS A VACCINE 25 UNIT/0.5ML IM SUSP: 0.5000 mL | Freq: Once | INTRAMUSCULAR | 0 refills | Status: AC

## 2017-07-06 NOTE — Telephone Encounter (Signed)
Rx for Hep A faxed to [facility] per pt request.

## 2017-07-08 ENCOUNTER — Encounter: Payer: Self-pay | Admitting: Emergency Medicine

## 2017-07-08 ENCOUNTER — Emergency Department: Payer: BLUE CROSS/BLUE SHIELD

## 2017-07-08 ENCOUNTER — Emergency Department
Admission: EM | Admit: 2017-07-08 | Discharge: 2017-07-08 | Disposition: A | Discharge status: Home / Self Care | Payer: BLUE CROSS/BLUE SHIELD | Attending: Emergency Medicine | Admitting: Emergency Medicine

## 2017-07-08 DIAGNOSIS — Z794 Long term (current) use of insulin: Secondary | ICD-10-CM | POA: Insufficient documentation

## 2017-07-08 DIAGNOSIS — E119 Type 2 diabetes mellitus without complications: Secondary | ICD-10-CM | POA: Diagnosis not present

## 2017-07-08 DIAGNOSIS — Z791 Long term (current) use of non-steroidal anti-inflammatories (NSAID): Secondary | ICD-10-CM | POA: Insufficient documentation

## 2017-07-08 DIAGNOSIS — I10 Essential (primary) hypertension: Secondary | ICD-10-CM | POA: Insufficient documentation

## 2017-07-08 DIAGNOSIS — G4453 Primary thunderclap headache: Secondary | ICD-10-CM | POA: Diagnosis not present

## 2017-07-08 DIAGNOSIS — Z79899 Other long term (current) drug therapy: Secondary | ICD-10-CM | POA: Insufficient documentation

## 2017-07-08 DIAGNOSIS — R51 Headache: Secondary | ICD-10-CM | POA: Insufficient documentation

## 2017-07-08 DIAGNOSIS — E039 Hypothyroidism, unspecified: Secondary | ICD-10-CM | POA: Insufficient documentation

## 2017-07-08 DIAGNOSIS — J45909 Unspecified asthma, uncomplicated: Secondary | ICD-10-CM | POA: Diagnosis not present

## 2017-07-08 DIAGNOSIS — R519 Headache, unspecified: Secondary | ICD-10-CM

## 2017-07-08 LAB — BASIC METABOLIC PANEL
ANION GAP: 14 (ref 5–15)
BUN: 9 mg/dL (ref 6–20)
CO2: 18 mmol/L — ABNORMAL LOW (ref 22–32)
Calcium: 9.1 mg/dL (ref 8.9–10.3)
Chloride: 101 mmol/L (ref 101–111)
Creatinine, Ser: 0.96 mg/dL (ref 0.44–1.00)
GLUCOSE: 282 mg/dL — AB (ref 65–99)
POTASSIUM: 3.6 mmol/L (ref 3.5–5.1)
SODIUM: 133 mmol/L — AB (ref 135–145)

## 2017-07-08 LAB — CBC WITH DIFFERENTIAL/PLATELET
BASOS ABS: 0 10*3/uL (ref 0–0.1)
BASOS PCT: 1 %
EOS ABS: 0.2 10*3/uL (ref 0–0.7)
EOS PCT: 4 %
HCT: 34 % — ABNORMAL LOW (ref 35.0–47.0)
HEMOGLOBIN: 10.8 g/dL — AB (ref 12.0–16.0)
Lymphocytes Relative: 21 %
Lymphs Abs: 1.1 10*3/uL (ref 1.0–3.6)
MCH: 25.4 pg — AB (ref 26.0–34.0)
MCHC: 31.7 g/dL — ABNORMAL LOW (ref 32.0–36.0)
MCV: 80.3 fL (ref 80.0–100.0)
Monocytes Absolute: 0.6 10*3/uL (ref 0.2–0.9)
Monocytes Relative: 11 %
NEUTROS PCT: 63 %
Neutro Abs: 3.4 10*3/uL (ref 1.4–6.5)
PLATELETS: 127 10*3/uL — AB (ref 150–440)
RBC: 4.24 MIL/uL (ref 3.80–5.20)
RDW: 18.6 % — ABNORMAL HIGH (ref 11.5–14.5)
WBC: 5.3 10*3/uL (ref 3.6–11.0)

## 2017-07-08 LAB — SEDIMENTATION RATE: SED RATE: 71 mm/h — AB (ref 0–20)

## 2017-07-08 MED ORDER — DIPHENHYDRAMINE HCL 50 MG/ML IJ SOLN
25.0000 mg | Freq: Once | INTRAMUSCULAR | Status: AC
  Administered 2017-07-08: 25 mg via INTRAVENOUS
  Filled 2017-07-08: qty 1

## 2017-07-08 MED ORDER — PROCHLORPERAZINE EDISYLATE 5 MG/ML IJ SOLN
10.0000 mg | Freq: Once | INTRAMUSCULAR | Status: AC
  Administered 2017-07-08: 10 mg via INTRAVENOUS
  Filled 2017-07-08: qty 2

## 2017-07-08 MED ORDER — SODIUM CHLORIDE 0.9 % IV BOLUS (SEPSIS)
1000.0000 mL | Freq: Once | INTRAVENOUS | Status: AC
  Administered 2017-07-08: 1000 mL via INTRAVENOUS

## 2017-07-08 MED ORDER — DEXAMETHASONE SODIUM PHOSPHATE 10 MG/ML IJ SOLN
10.0000 mg | Freq: Once | INTRAMUSCULAR | Status: AC
  Administered 2017-07-08: 10 mg via INTRAVENOUS
  Filled 2017-07-08: qty 1

## 2017-07-08 NOTE — ED Notes (Signed)
Pt. Verbalizes understanding of d/c instructions and follow-up. VS stable and pain controlled per pt.  Pt. In NAD at time of d/c and denies further concerns regarding this visit. Pt. Stable at the time of departure from the unit, departing unit by the safest and most appropriate manner per that pt condition and limitations. Pt advised to return to the ED at any time for emergent concerns, or for new/worsening symptoms.   

## 2017-07-08 NOTE — ED Provider Notes (Signed)
[facility] Emergency Department Provider Note  ____________________________________________   First MD Initiated Contact with Patient 07/08/17 1428     (approximate)  I have reviewed the triage vital signs and the nursing notes.   HISTORY  Chief Complaint Headache   HPI Amber Odonnell is a 46 y.o. female with a history of diabetes as well as hypertension who is presenting to the emergency department with persistent headache. She was recently seen in the emergency department this past Sunday. She had a sudden onset headache which started on Saturday. She had a CAT scan with an LP and then felt improved after headache cocktail. However, she says the headache never completely went away and now is back to a 9 out of 10 tightness that is most present in the front of her head but also present over the bi-lateral temples. Patient denies any blurred vision but does say that when she opens her eyes sometimes feels dizzy. She also says that she feels like she is stumbling when she is walking. Says that she is also had body aches earlier today. Says that she is concerned because she recently had a flu shot is concerned about her reaction from this.   Past Medical History:  Diagnosis Date  . Arthritis    of knee  . Asthma   . Chronic gastritis   . Cirrhosis of liver without ascites ([place])   . Diabetes mellitus without complication ([place])    type 2  . Dyspnea   . Generalized anxiety disorder   . GERD (gastroesophageal reflux disease)   . Hyperlipidemia   . Hypertension   . Hypothyroidism   . Irritable bowel syndrome (IBS)    w/ constipation  . Obesity (BMI 30-39.9)   . Portal hypertension ([place])   . Sleep apnea    CPAP  . Splenomegaly   . Thrombocytopenia [facility])     Patient Active Problem List   Diagnosis Date Noted  . Secondary esophageal varices with bleeding ([place])   . Vaginal flatus 03/13/2017  . Eye discharge 03/13/2017  . Esophageal varices determined  by endoscopy ([place]) 03/11/2017  . Abnormal feces   . Polyp of sigmoid colon   . Gastritis without bleeding   . Environmental and seasonal allergies 01/21/2017  . Type 2 diabetes mellitus without complication, with long-term current use of insulin ([place]) 01/21/2017  . Hypothyroidism due to acquired atrophy of thyroid 01/21/2017  . Hyperlipidemia associated with type 2 diabetes mellitus ([place]) 01/21/2017  . Irritable bowel syndrome with constipation 01/21/2017  . Obstructive sleep apnea syndrome 01/21/2017  . Thrombocytopenia ([place])   . Splenomegaly   . Portal hypertension ([place])   . Obesity (BMI 30-39.9)   . Essential hypertension   . Hyperlipidemia   . Generalized anxiety disorder   . Hepatic cirrhosis ([place])   . Chronic gastritis   . Arthritis     Past Surgical History:  Procedure Laterality Date  . ESOPHAGOGASTRODUODENOSCOPY (EGD) WITH PROPOFOL N/A 02/26/2017   Procedure: ESOPHAGOGASTRODUODENOSCOPY (EGD) WITH Banding;  Surgeon: [name], MD;  Location: [place];  Service: Endoscopy;  Laterality: N/A;  . ESOPHAGOGASTRODUODENOSCOPY (EGD) WITH PROPOFOL N/A 04/10/2017   Procedure: ESOPHAGOGASTRODUODENOSCOPY (EGD) WITH PROPOFOL;  Surgeon: [name], MD;  Location: [place];  Service: Endoscopy;  Laterality: N/A;  Diabetic  sleep apnea  with banding  . TONSILLECTOMY    . TOTAL VAGINAL HYSTERECTOMY     endometriosis    Prior to Admission medications   Medication Sig Start Date End  Date Taking? Authorizing Provider  B Complex Vitamins (B-COMPLEX/B-12 SL) Place under the tongue every morning.    [provider]  BD PEN NEEDLE NANO U/F 32G X 4 MM MISC Inject 1 each into the skin 4 (four) times daily. 01/09/17   [provider]  butalbital-acetaminophen-caffeine [employer], ESGIC) 56-387-56 MG tablet Take 1-2 tablets by mouth every 6 (six) hours as needed for headache. 07/05/17 07/05/18  Darel Hong, MD  diclofenac (VOLTAREN) 75 MG EC tablet Take  75 mg by mouth 2 (two) times daily.    [provider]  empagliflozin (JARDIANCE) 25 MG TABS tablet Take 25 mg by mouth daily. 01/22/17   Glean Hess, MD  furosemide (LASIX) 40 MG tablet Take 1 tablet (40 mg total) by mouth daily. 04/10/17 04/10/18  [name], MD  GENERLAC 10 GM/15ML SOLN TK 15 ML PO BID 02/18/17   [provider]  ibuprofen (ADVIL,MOTRIN) 600 MG tablet Take 1 tablet (600 mg total) by mouth every 8 (eight) hours as needed. 07/05/17   Darel Hong, MD  insulin aspart (NOVOLOG FLEXPEN) 100 UNIT/ML FlexPen Inject 60 Units into the skin 3 (three) times daily with meals. 06/09/17   Glean Hess, MD  Insulin Degludec (TRESIBA FLEXTOUCH [place]) Inject 80 Units into the skin daily.    [provider]  lactulose, encephalopathy, (GENERLAC) 10 GM/15ML SOLN TAKE 15 ML BY MOUTH TWICE DAILY AS NEEDED FOR MILD CONSTIPATION 05/13/17   [name], MD  levothyroxine (SYNTHROID, LEVOTHROID) 50 MCG tablet Take 1 tablet (50 mcg total) by mouth daily before breakfast. 04/20/17   Glean Hess, MD  loratadine (CLARITIN) 10 MG tablet Take 1 tablet (10 mg total) by mouth daily. 04/20/17   Glean Hess, MD  losartan (COZAAR) 50 MG tablet Take 1 tablet (50 mg total) by mouth daily. May take 1/2 twice a day 04/22/17   Glean Hess, MD  [facility] OMEGA-3 KRILL OIL PO Take by mouth.    [provider]  metFORMIN (GLUCOPHAGE-XR) 500 MG 24 hr tablet Take 1,000 mg by mouth 2 (two) times daily.    [provider]  montelukast (SINGULAIR) 10 MG tablet Take 1 tablet (10 mg total) by mouth at bedtime. 04/20/17   Glean Hess, MD  Multiple Vitamins-Minerals (ALIVE ONCE DAILY WOMENS 50+ PO) Take by mouth.    [provider]  mupirocin ointment (BACTROBAN) 2 % Apply 1 application topically 3 (three) times daily. 03/20/17   Lorin Picket, PA-C  nadolol (CORGARD) 20 MG tablet Take 20 mg by mouth daily.    [provider]  pantoprazole  (PROTONIX) 40 MG tablet Take 1 tablet (40 mg total) by mouth daily. 04/16/17   [name], MD  rifaximin (XIFAXAN) 200 MG tablet Take 200 mg by mouth 2 (two) times daily.    [provider]  rosuvastatin (CRESTOR) 10 MG tablet Take 10 mg by mouth daily.    [provider]  sertraline (ZOLOFT) 50 MG tablet TAKE 1 AND 1/2 TABLETS(75 MG) BY MOUTH DAILY 06/05/17   Glean Hess, MD    Allergies Hydrocodone  Family History  Problem Relation Age of Onset  . Breast cancer Mother   . Diabetes Mother   . Diabetes Father   . Hypertension Father   . Cancer Paternal Grandmother     Social History Social History  Substance Use Topics  . Smoking status: Never Smoker  . Smokeless tobacco: Never Used  . Alcohol use No  Review of Systems  Constitutional: No fever/chills Eyes: No visual changes. ENT: No sore throat. Cardiovascular: Denies chest pain. Respiratory: Denies shortness of breath. Gastrointestinal: No abdominal pain.  No nausea, no vomiting.  No diarrhea.  No constipation. Genitourinary: Negative for dysuria. Musculoskeletal: Negative for back pain. Skin: Negative for rash. Neurological: Negative for headaches, focal weakness or numbness.   ____________________________________________   PHYSICAL EXAM:  VITAL SIGNS: ED Triage Vitals  Enc Vitals Group     BP 07/08/17 1106 (!) 98/51     Pulse Rate 07/08/17 1106 84     Resp 07/08/17 1106 16     Temp 07/08/17 1106 98.1 F (36.7 C)     Temp Source 07/08/17 1106 Oral     SpO2 07/08/17 1106 97 %     Weight 07/08/17 1107 173 lb (78.5 kg)     Height 07/08/17 1107 5\' 3"  (1.6 m)     Head Circumference --      Peak Flow --      Pain Score 07/08/17 1114 9     Pain Loc --      Pain Edu? --      Excl. in [place]? --     Constitutional: Alert and oriented. Well appearing and in no acute distress. Eyes: Conjunctivae are normal. no nystagmus. Head: Atraumatic.No tenderness nor nodularity along the  distribution of the bilateral temporal arteries. Nose: No congestion/rhinnorhea. Mouth/Throat: Mucous membranes are moist.  Neck: No stridor.   Cardiovascular: Normal rate, regular rhythm. Grossly normal heart sounds.   Respiratory: Normal respiratory effort.  No retractions. Lungs CTAB. Gastrointestinal: Soft and nontender. No distention.  Musculoskeletal: No lower extremity tenderness nor edema.  No joint effusions. Neurologic:  Normal speech and language. No gross focal neurologic deficits are appreciated.no ataxia on finger to nose testing. Skin:  Skin is warm, dry and intact. No rash noted. Psychiatric: Mood and affect are normal. Speech and behavior are normal.  ____________________________________________   LABS (all labs ordered are listed, but only abnormal results are displayed)  Labs Reviewed  CBC WITH DIFFERENTIAL/PLATELET  SEDIMENTATION RATE  BASIC METABOLIC PANEL   ____________________________________________  EKG   ____________________________________________  RADIOLOGY   ____________________________________________   PROCEDURES  Procedure(s) performed:   Procedures  Critical Care performed:   ____________________________________________   INITIAL IMPRESSION / ASSESSMENT AND PLAN / ED COURSE  Pertinent labs & imaging results that were available during my care of the patient were reviewed by me and considered in my medical decision making (see chart for details).  DDX: Tension headache, migraine headache, stroke, posterior circulation insufficiency, cerebral aneurysm, giant cell arteritis  ----------------------------------------- 3:18 PM on 07/08/2017 -----------------------------------------  Patient had a thorough workup 2 days ago with CT and lumbar puncture which did not indicate evidence of a ruptured aneurysm. However, patient is persistently with pain. We will again give her headache cocktail and perform an MRI/MRA of the brain and the  neck to test for any vascular abnormality or sign of stroke. Also to send sedimentation rate because of temporal pain. However, the patient did not have any tenderness to palpation to bilateral distributions of the temporal arteries. No loss of vision. Signed out to Dr. Alfred Levins.       ____________________________________________   FINAL CLINICAL IMPRESSION(S) / ED DIAGNOSES  Final diagnoses:  Nonintractable headache, unspecified chronicity pattern, unspecified headache type      NEW MEDICATIONS STARTED DURING THIS VISIT:  New Prescriptions   No medications on file     Note:  This document  was prepared using Systems analyst and may include unintentional dictation errors.     Orbie Pyo, MD 07/08/17 1520

## 2017-07-08 NOTE — ED Notes (Signed)
Patient transported to MRI 

## 2017-07-08 NOTE — ED Provider Notes (Signed)
-----------------------------------------   3:31 PM on 07/08/2017 -----------------------------------------   Blood pressure (!) 98/51, pulse 84, temperature 98.1 F (36.7 C), temperature source Oral, resp. rate 16, height _0  (1.6 m), weight 78.5 kg (173 lb), SpO2 97 %.  Assuming care from Dr. Clearnce Hasten of [place] [facility] is a 46 y.o. female with a chief complaint of Headache .    Please refer to H&P by previous MD for further details.  The current plan of care is to f/u MRI/MRA head and neck and reassess after migraine cocktail.  _________________________ 6:25 PM on 07/08/2017 ----------------------------------------- MR Angiogram Head Wo Contrast (Final result)  Result time 07/08/17 18:07:34  Final result by Bella Kennedy, MD (07/08/17 18:07:34)           Narrative:   CLINICAL DATA: [place] headache  EXAM: MRI HEAD WITHOUT CONTRAST  MRA HEAD WITHOUT CONTRAST  MRA NECK WITHOUT CONTRAST  TECHNIQUE: Multiplanar, multiecho pulse sequences of the brain and surrounding structures were obtained without intravenous contrast. Angiographic images of the Circle of Willis were obtained using MRA technique without intravenous contrast. Angiographic images of the neck were obtained using MRA technique without intravenous contrast. Carotid stenosis measurements (when applicable) are obtained utilizing NASCET criteria, using the distal internal carotid diameter as the denominator.  COMPARISON: Head CT 07/05/2017  FINDINGS: MRI HEAD FINDINGS  Brain: Enlarged pituitary gland, measuring 13 mm in craniocaudal dimension. No focal diffusion restriction to indicate acute infarct. No intraparenchymal hemorrhage. The brain parenchymal signal is normal. No mass lesion. No chronic microhemorrhage or cerebral amyloid angiopathy. No hydrocephalus, age advanced atrophy or lobar predominant volume loss. No dural abnormality or extra-axial collection.  Skull and upper cervical  spine: The visualized skull base, calvarium, upper cervical spine and extracranial soft tissues are normal.  Sinuses/Orbits: No fluid levels or advanced mucosal thickening. No mastoid effusion. Normal orbits.  MRA HEAD FINDINGS  Intracranial internal carotid arteries: Normal.  Anterior cerebral arteries: Normal.  Middle cerebral arteries: Normal.  Posterior communicating arteries: Absent.  Posterior cerebral arteries: Normal.  Basilar artery: Normal.  Vertebral arteries: Left dominant. Normal.  Superior cerebellar arteries: Normal.  Anterior inferior cerebellar arteries: Not clearly visualized, which is not uncommon.  Posterior inferior cerebellar arteries: Normal.  MRA NECK FINDINGS  Normal carotid and vertebral arterial systems.  IMPRESSION: 1. Enlarged pituitary gland for age, as previously described. No mass effect on the optic chiasm or optic nerves. Correlation with endocrine function tests is recommended. Recommend correlation with endocrine function tests and 6-12 month follow-up pituitary MRI. This recommendation follows ACR consensus guidelines: White Paper of the ACR Incidental Findings Committee. 2. Otherwise normal MRI of the brain. 3. Normal MRA of the head and neck.         Patient reports HA for several days, worse of her life. Has had negative CT and LP with no evidence of SAH or meningitis. HA started a few days after she received flu shot. Patient is neuro intact, no temporal artery tenderness, no jaw claudication, no changes in vision. Labs showing elevated ESR at 76 which I believe are due to several systemic diseases associated with increase systemic inflammation such as cirrhosis and diabetes. Pain has fully resolved with migraine cocktail. Recommended close f/u with PCP and Neurology. Discussed return precautions for signs of stroke, changes in vision, jaw claudication, fever, or any new symptoms concerning to the patient. Patient will be dc home  on supportive care.        Rudene Re, MD 07/08/17 [phone]

## 2017-07-08 NOTE — ED Triage Notes (Addendum)
Pt reports seen here 07/05/17 for headache and discharged. Pt reports headache continues and is unchanged. Pt reports sensitivity to light. Denies nausea or vomiting. Pt reports taking fioricet and ibuprofen without relief. No protocols at this time per Dr. Jimmye Norman.

## 2017-07-09 ENCOUNTER — Encounter: Payer: Self-pay | Admitting: Internal Medicine

## 2017-07-09 LAB — CSF CULTURE W GRAM STAIN: Gram Stain: NONE SEEN

## 2017-07-09 LAB — CSF CULTURE: CULTURE: NO GROWTH

## 2017-07-10 ENCOUNTER — Encounter: Payer: Self-pay | Admitting: Internal Medicine

## 2017-07-10 ENCOUNTER — Ambulatory Visit (INDEPENDENT_AMBULATORY_CARE_PROVIDER_SITE_OTHER): Payer: BLUE CROSS/BLUE SHIELD | Admitting: Internal Medicine

## 2017-07-10 VITALS — BP 92/56 | HR 62 | Ht 63.0 in | Wt 179.0 lb

## 2017-07-10 DIAGNOSIS — G4489 Other headache syndrome: Secondary | ICD-10-CM | POA: Diagnosis not present

## 2017-07-10 DIAGNOSIS — R51 Headache: Secondary | ICD-10-CM | POA: Diagnosis not present

## 2017-07-10 DIAGNOSIS — E236 Other disorders of pituitary gland: Secondary | ICD-10-CM | POA: Diagnosis not present

## 2017-07-10 DIAGNOSIS — I10 Essential (primary) hypertension: Secondary | ICD-10-CM

## 2017-07-10 DIAGNOSIS — I959 Hypotension, unspecified: Secondary | ICD-10-CM | POA: Diagnosis not present

## 2017-07-10 DIAGNOSIS — I8500 Esophageal varices without bleeding: Secondary | ICD-10-CM

## 2017-07-10 NOTE — Patient Instructions (Signed)
Stop taking Nadolol  Measure and record blood pressure twice a day - goal BP is about 110/60-70  Discuss Pituitary issue with Dr. Gabriel Carina

## 2017-07-10 NOTE — Progress Notes (Signed)
Date:  07/10/2017   Name:  Amber Odonnell   DOB:  [birth year]   MRN:  [id number]   Chief Complaint: Migraine (Seen neurologist this morning. They want to do a preventative maintanance if it continuous to happen. ) and Hypotension (Neuro took BP at visit this morning and first 80/46 and then about an hour later recheck 77/46. Having some dizziness. )  Migraine   This is a new (seen by Neurology today - will be keeping a diary) problem. Episode frequency: 2 severe episodes. Associated symptoms include dizziness. Pertinent negatives include no eye pain, fever, numbness, tinnitus or weakness. Her past medical history is significant for hypertension.  Hypertension  This is a chronic problem. The problem has been rapidly improving (very low recently) since onset. Associated symptoms include headaches. Pertinent negatives include no chest pain or shortness of breath.  Pituitary enlargement - noted on MRI to have a 13 mm pituitary gland that appeared homogeneous. Review of her CT done 2 days before showed a 11 mm pituitary gland. She was not given any advice with regards to this. Discussed possible symptoms such as double vision, galactorrhea etc. She denies any symptoms other than mild vertigo. She will be seeing her endocrinologist in 2 weeks and recommend that she discuss testing with her. Esophageal varices/cirrhosis - patient is on Corgard to reduce pressure on esophageal varices. However she has undergone esophageal banding twice, the last in July with banding of 3 varices in complete resolution. Reviewing the note from transplant specialist, she does not need to be on Corgard if she has undergone banding. She is no longer being considered for transplant at this time. She will follow-up with Dr. Allen Norris as needed  Review of Systems  Constitutional: Negative for chills, fatigue and fever.  HENT: Negative for tinnitus.   Eyes: Negative for pain and visual disturbance.  Respiratory: Negative for chest  tightness and shortness of breath.   Cardiovascular: Negative for chest pain.  Neurological: Positive for dizziness and headaches. Negative for tremors, weakness and numbness.    Patient Active Problem List   Diagnosis Date Noted  . Secondary esophageal varices with bleeding ([place])   . Vaginal flatus 03/13/2017  . Eye discharge 03/13/2017  . Esophageal varices determined by endoscopy ([place]) 03/11/2017  . Abnormal feces   . Polyp of sigmoid colon   . Gastritis without bleeding   . Environmental and seasonal allergies 01/21/2017  . Type 2 diabetes mellitus without complication, with long-term current use of insulin ([place]) 01/21/2017  . Hypothyroidism due to acquired atrophy of thyroid 01/21/2017  . Hyperlipidemia associated with type 2 diabetes mellitus ([place]) 01/21/2017  . Irritable bowel syndrome with constipation 01/21/2017  . Obstructive sleep apnea syndrome 01/21/2017  . Thrombocytopenia ([place])   . Splenomegaly   . Portal hypertension ([place])   . Obesity (BMI 30-39.9)   . Essential hypertension   . Hyperlipidemia   . Generalized anxiety disorder   . Hepatic cirrhosis ([place])   . Chronic gastritis   . Arthritis     Prior to Admission medications   Medication Sig Start Date End Date Taking? Authorizing Provider  B Complex Vitamins (B-COMPLEX/B-12 SL) Place under the tongue every morning.   Yes [provider]  BD PEN NEEDLE NANO U/F 32G X 4 MM MISC Inject 1 each into the skin 4 (four) times daily. 01/09/17  Yes [provider]  diclofenac (VOLTAREN) 75 MG EC tablet Take 75 mg by mouth 2 (two) times daily.   Yes [provider]  fluticasone (FLONASE) 50 MCG/ACT nasal spray Place 2 sprays into both nostrils daily.   Yes [provider]  furosemide (LASIX) 40 MG tablet Take 1 tablet (40 mg total) by mouth daily. Patient taking differently: Take 10 mg by mouth daily.  04/10/17 04/10/18 Yes Wohl, Darren, MD  insulin aspart (NOVOLOG FLEXPEN) 100 UNIT/ML FlexPen  Inject 60 Units into the skin 3 (three) times daily with meals. Patient taking differently: Inject 30 Units into the skin 3 (three) times daily with meals.  06/09/17  Yes Glean Hess, MD  Insulin Degludec (TRESIBA FLEXTOUCH [place]) Inject 92 Units into the skin daily.    Yes [provider]  levothyroxine (SYNTHROID, LEVOTHROID) 50 MCG tablet Take 1 tablet (50 mcg total) by mouth daily before breakfast. 04/20/17  Yes Glean Hess, MD  linaclotide [facility]) 290 MCG CAPS capsule Take 290 mcg by mouth daily before breakfast.   Yes [provider]  loratadine (CLARITIN) 10 MG tablet Take 1 tablet (10 mg total) by mouth daily. 04/20/17  Yes Glean Hess, MD  losartan (COZAAR) 50 MG tablet Take 1 tablet (50 mg total) by mouth daily. May take 1/2 twice a day 04/22/17  Yes Glean Hess, MD  MEGARED OMEGA-3 KRILL OIL PO Take by mouth.   Yes [provider]  montelukast (SINGULAIR) 10 MG tablet Take 1 tablet (10 mg total) by mouth at bedtime. 04/20/17  Yes Glean Hess, MD  Multiple Vitamins-Minerals (ALIVE ONCE DAILY WOMENS 50+ PO) Take by mouth.   Yes [provider]  nadolol (CORGARD) 20 MG tablet Take 20 mg by mouth daily.   Yes [provider]  pantoprazole (PROTONIX) 40 MG tablet Take 1 tablet (40 mg total) by mouth daily. 04/16/17  Yes [name], MD  rosuvastatin (CRESTOR) 10 MG tablet Take 10 mg by mouth daily.   Yes [provider]  sertraline (ZOLOFT) 50 MG tablet TAKE 1 AND 1/2 TABLETS(75 MG) BY MOUTH DAILY 06/05/17  Yes Glean Hess, MD    Allergies  Allergen Reactions  . Hydrocodone Other (See Comments)    thrush    Past Surgical History:  Procedure Laterality Date  . ESOPHAGOGASTRODUODENOSCOPY (EGD) WITH PROPOFOL N/A 02/26/2017   Procedure: ESOPHAGOGASTRODUODENOSCOPY (EGD) WITH Banding;  Surgeon: [name], MD;  Location: [place];  Service: Endoscopy;  Laterality: N/A;  . ESOPHAGOGASTRODUODENOSCOPY  (EGD) WITH PROPOFOL N/A 04/10/2017   Procedure: ESOPHAGOGASTRODUODENOSCOPY (EGD) WITH PROPOFOL;  Surgeon: [name], MD;  Location: [place];  Service: Endoscopy;  Laterality: N/A;  Diabetic  sleep apnea  with banding  . TONSILLECTOMY    . TOTAL VAGINAL HYSTERECTOMY     endometriosis    Social History  Substance Use Topics  . Smoking status: Never Smoker  . Smokeless tobacco: Never Used  . Alcohol use No     Medication list has been reviewed and updated.  PHQ 2/9 Scores 07/10/2017  PHQ - 2 Score 0    Physical Exam  Constitutional: She is oriented to person, place, and time. She appears well-developed. No distress.  HENT:  Head: Normocephalic and atraumatic.  Eyes: Conjunctivae and EOM are normal.  Neck: Normal range of motion. Neck supple.  Cardiovascular: Normal rate, regular rhythm and normal heart sounds.   Pulmonary/Chest: Effort normal and breath sounds normal. No respiratory distress. She has no wheezes.  Musculoskeletal: Normal range of motion. She exhibits no edema or tenderness.  Neurological: She is alert and oriented to person, place, and  time.  Skin: Skin is warm and dry. No rash noted.  Psychiatric: She has a normal mood and affect. Her behavior is normal. Thought content normal.  Nursing note and vitals reviewed.   BP (!) 92/56   Pulse 62   Ht 5\' 3"  (1.6 m)   Wt 179 lb (81.2 kg)   SpO2 98%   BMI 31.71 kg/m   Assessment and Plan: 1. Essential hypertension Running low - will stop Corgard but continue Losartan Pt instructed to monitor at home - goal >110 syst  2. Enlarged pituitary gland (HCC) To discuss with Dr. Gabriel Carina at appointment this month  3. Esophageal varices determined by endoscopy [facility]) Follow up with Dr. Allen Norris for annual surveillance   No orders of the defined types were placed in this encounter.   Partially dictated using Editor, commissioning. Any errors are unintentional.  Halina Maidens, MD [facility]  07/10/2017

## 2017-07-12 ENCOUNTER — Emergency Department
Admission: EM | Admit: 2017-07-12 | Discharge: 2017-07-12 | Disposition: A | Discharge status: Home / Self Care | Payer: BLUE CROSS/BLUE SHIELD | Attending: Emergency Medicine | Admitting: Emergency Medicine

## 2017-07-12 DIAGNOSIS — R031 Nonspecific low blood-pressure reading: Secondary | ICD-10-CM | POA: Diagnosis present

## 2017-07-12 DIAGNOSIS — J45909 Unspecified asthma, uncomplicated: Secondary | ICD-10-CM | POA: Insufficient documentation

## 2017-07-12 DIAGNOSIS — T501X5A Adverse effect of loop [high-ceiling] diuretics, initial encounter: Secondary | ICD-10-CM | POA: Diagnosis not present

## 2017-07-12 DIAGNOSIS — E119 Type 2 diabetes mellitus without complications: Secondary | ICD-10-CM | POA: Insufficient documentation

## 2017-07-12 DIAGNOSIS — E039 Hypothyroidism, unspecified: Secondary | ICD-10-CM | POA: Diagnosis not present

## 2017-07-12 DIAGNOSIS — E860 Dehydration: Secondary | ICD-10-CM | POA: Insufficient documentation

## 2017-07-12 DIAGNOSIS — Z794 Long term (current) use of insulin: Secondary | ICD-10-CM | POA: Diagnosis not present

## 2017-07-12 DIAGNOSIS — Z79899 Other long term (current) drug therapy: Secondary | ICD-10-CM | POA: Diagnosis not present

## 2017-07-12 DIAGNOSIS — I10 Essential (primary) hypertension: Secondary | ICD-10-CM | POA: Insufficient documentation

## 2017-07-12 LAB — CBC
HEMATOCRIT: 33.1 % — AB (ref 35.0–47.0)
HEMOGLOBIN: 10.6 g/dL — AB (ref 12.0–16.0)
MCH: 25.6 pg — AB (ref 26.0–34.0)
MCHC: 32.1 g/dL (ref 32.0–36.0)
MCV: 79.7 fL — AB (ref 80.0–100.0)
Platelets: 112 10*3/uL — ABNORMAL LOW (ref 150–440)
RBC: 4.15 MIL/uL (ref 3.80–5.20)
RDW: 19.1 % — ABNORMAL HIGH (ref 11.5–14.5)
WBC: 6.9 10*3/uL (ref 3.6–11.0)

## 2017-07-12 LAB — COMPREHENSIVE METABOLIC PANEL
ALBUMIN: 3.4 g/dL — AB (ref 3.5–5.0)
ALK PHOS: 156 U/L — AB (ref 38–126)
ALT: 45 U/L (ref 14–54)
ANION GAP: 13 (ref 5–15)
AST: 96 U/L — ABNORMAL HIGH (ref 15–41)
BILIRUBIN TOTAL: 1.5 mg/dL — AB (ref 0.3–1.2)
BUN: 14 mg/dL (ref 6–20)
CALCIUM: 9.2 mg/dL (ref 8.9–10.3)
CO2: 23 mmol/L (ref 22–32)
Chloride: 96 mmol/L — ABNORMAL LOW (ref 101–111)
Creatinine, Ser: 1.31 mg/dL — ABNORMAL HIGH (ref 0.44–1.00)
GFR calc Af Amer: 55 mL/min — ABNORMAL LOW (ref >60)
GFR calc non Af Amer: 47 mL/min — ABNORMAL LOW (ref >60)
GLUCOSE: 369 mg/dL — AB (ref 65–99)
Potassium: 3.7 mmol/L (ref 3.5–5.1)
Sodium: 132 mmol/L — ABNORMAL LOW (ref 135–145)
TOTAL PROTEIN: 7.7 g/dL (ref 6.5–8.1)

## 2017-07-12 LAB — TSH: TSH: 5.166 u[IU]/mL — ABNORMAL HIGH (ref 0.350–4.500)

## 2017-07-12 LAB — T4, FREE: FREE T4: 1.01 ng/dL (ref 0.61–1.12)

## 2017-07-12 LAB — LIPASE, BLOOD: Lipase: 83 U/L — ABNORMAL HIGH (ref 11–51)

## 2017-07-12 MED ORDER — SODIUM CHLORIDE 0.9 % IV BOLUS (SEPSIS)
1000.0000 mL | Freq: Once | INTRAVENOUS | Status: AC
  Administered 2017-07-12: 1000 mL via INTRAVENOUS

## 2017-07-12 MED ORDER — IBUPROFEN 800 MG PO TABS
800.0000 mg | ORAL_TABLET | Freq: Once | ORAL | Status: AC
  Administered 2017-07-12: 800 mg via ORAL
  Filled 2017-07-12: qty 1

## 2017-07-12 MED ORDER — LACTATED RINGERS IV BOLUS (SEPSIS)
1000.0000 mL | Freq: Once | INTRAVENOUS | Status: AC
  Administered 2017-07-12: 1000 mL via INTRAVENOUS
  Filled 2017-07-12: qty 1000

## 2017-07-12 NOTE — ED Notes (Signed)
Joni Fears MD made aware of BP.

## 2017-07-12 NOTE — Discharge Instructions (Signed)
stop taking the furosemide until he can follow up with your doctor. Continue eating regularly and drinking plenty of fluids to stay hydrated. This may worsen your IBS symptoms, but should improve your blood pressure.

## 2017-07-12 NOTE — ED Triage Notes (Signed)
Pt c/o low blood pressure since Friday as well as a headache. Pt takes losartan but has not taken it since Friday. Has been seen for headache in past, recently had CT and lumbar puncture and nothing was found. bp 90/41. Hr 105.

## 2017-07-12 NOTE — ED Provider Notes (Signed)
[facility] Emergency Department Provider Note  ____________________________________________  Time seen: Approximately 9:27 PM  I have reviewed the triage vital signs and the nursing notes.   HISTORY  Chief Complaint  low blood pressure and generalized weakness   HPI Malon Siddall is a 46 y.o. female who complains of low blood pressure for the past week associated with a generalized headache. She had an CT scan of her head and lumbar puncture done a week ago which were unremarkable. Reports that throughout the week she's discontinued her blood pressure medicines but still is having low blood pressure. One of them is a beta blocker to decrease her heart rate and now her heart rate is higher as well. She is still taking her furosemide which was given to her by gastroenterology for her IBS symptoms.  No aggravating or relieving factors, not positional. Denies dizziness or syncope. No chest pain or shortness of breath.     Past Medical History:  Diagnosis Date  . Arthritis    of knee  . Asthma   . Chronic gastritis   . Cirrhosis of liver without ascites ([place])   . Diabetes mellitus without complication ([place])    type 2  . Dyspnea   . Generalized anxiety disorder   . GERD (gastroesophageal reflux disease)   . Hyperlipidemia   . Hypertension   . Hypothyroidism   . Irritable bowel syndrome (IBS)    w/ constipation  . Obesity (BMI 30-39.9)   . Portal hypertension ([place])   . Sleep apnea    CPAP  . Splenomegaly   . Thrombocytopenia [facility])      Patient Active Problem List   Diagnosis Date Noted  . Enlarged pituitary gland ([place]) 07/10/2017  . Headache syndrome 07/10/2017  . Secondary esophageal varices with bleeding ([place])   . Vaginal flatus 03/13/2017  . Eye discharge 03/13/2017  . Esophageal varices determined by endoscopy ([place]) 03/11/2017  . Abnormal feces   . Polyp of sigmoid colon   . Gastritis without bleeding   . Environmental and seasonal  allergies 01/21/2017  . Type 2 diabetes mellitus without complication, with long-term current use of insulin ([place]) 01/21/2017  . Hypothyroidism due to acquired atrophy of thyroid 01/21/2017  . Hyperlipidemia associated with type 2 diabetes mellitus ([place]) 01/21/2017  . Irritable bowel syndrome with constipation 01/21/2017  . Obstructive sleep apnea syndrome 01/21/2017  . Thrombocytopenia ([place])   . Splenomegaly   . Portal hypertension ([place])   . Obesity (BMI 30-39.9)   . Essential hypertension   . Hyperlipidemia   . Generalized anxiety disorder   . Hepatic cirrhosis ([place])   . Chronic gastritis   . Arthritis      Past Surgical History:  Procedure Laterality Date  . ESOPHAGOGASTRODUODENOSCOPY (EGD) WITH PROPOFOL N/A 02/26/2017   Procedure: ESOPHAGOGASTRODUODENOSCOPY (EGD) WITH Banding;  Surgeon: [name], MD;  Location: [place];  Service: Endoscopy;  Laterality: N/A;  . ESOPHAGOGASTRODUODENOSCOPY (EGD) WITH PROPOFOL N/A 04/10/2017   Procedure: ESOPHAGOGASTRODUODENOSCOPY (EGD) WITH PROPOFOL;  Surgeon: [name], MD;  Location: [place];  Service: Endoscopy;  Laterality: N/A;  Diabetic  sleep apnea  with banding  . TONSILLECTOMY    . TOTAL VAGINAL HYSTERECTOMY     endometriosis     Prior to Admission medications   Medication Sig Start Date End Date Taking? Authorizing Provider  B Complex Vitamins (B-COMPLEX/B-12 SL) Place under the tongue every morning.   Yes [provider]  empagliflozin (JARDIANCE) 10 MG TABS tablet Take 10  mg by mouth daily.   Yes [provider]  furosemide (LASIX) 40 MG tablet Take 1 tablet (40 mg total) by mouth daily. 04/10/17 04/10/18 Yes [name], MD  insulin aspart protamine- aspart (NOVOLOG MIX 70/30) (70-30) 100 UNIT/ML injection Inject 60 Units into the skin 3 (three) times daily.   Yes [provider]  Insulin Degludec (TRESIBA FLEXTOUCH ) Inject 92 Units into the skin daily.    Yes [provider]  lactulose (CEPHULAC) 20 g packet Take 20 g by mouth daily.   Yes [provider]  levothyroxine (SYNTHROID, LEVOTHROID) 50 MCG tablet Take 1 tablet (50 mcg total) by mouth daily before breakfast. 04/20/17  Yes Glean Hess, MD  loratadine (CLARITIN) 10 MG tablet Take 1 tablet (10 mg total) by mouth daily. 04/20/17  Yes Glean Hess, MD  losartan (COZAAR) 50 MG tablet Take 1 tablet (50 mg total) by mouth daily. May take 1/2 twice a day 04/22/17  Yes Glean Hess, MD  MEGARED OMEGA-3 KRILL OIL PO Take by mouth.   Yes [provider]  montelukast (SINGULAIR) 10 MG tablet Take 1 tablet (10 mg total) by mouth at bedtime. 04/20/17  Yes Glean Hess, MD  Multiple Vitamins-Minerals (ALIVE ONCE DAILY WOMENS 50+ PO) Take by mouth.   Yes [provider]  pantoprazole (PROTONIX) 40 MG tablet Take 1 tablet (40 mg total) by mouth daily. 04/16/17  Yes [name], MD  rosuvastatin (CRESTOR) 10 MG tablet Take 10 mg by mouth daily.   Yes [provider]  sertraline (ZOLOFT) 50 MG tablet TAKE 1 AND 1/2 TABLETS(75 MG) BY MOUTH DAILY Patient taking differently: 1.5 - 2 tabs 06/05/17  Yes Glean Hess, MD  BD PEN NEEDLE NANO U/F 32G X 4 MM MISC Inject 1 each into the skin 4 (four) times daily. 01/09/17   [provider]  fluticasone (FLONASE) 50 MCG/ACT nasal spray Place 2 sprays into both nostrils daily.    [provider]  insulin aspart (NOVOLOG FLEXPEN) 100 UNIT/ML FlexPen Inject 60 Units into the skin 3 (three) times daily with meals. Patient not taking: Reported on 07/12/2017 06/09/17   Glean Hess, MD     Allergies Hydrocodone   Family History  Problem Relation Age of Onset  . Breast cancer Mother   . Diabetes Mother   . Diabetes Father   . Hypertension Father   . Cancer Paternal Grandmother     Social History Social History  Substance Use Topics  . Smoking status: Never Smoker  . Smokeless tobacco: Never  Used  . Alcohol use No    Review of Systems  Constitutional:   No fever or chills.  ENT:   No sore throat. No rhinorrhea. Cardiovascular:   No chest pain or syncope. Respiratory:   No dyspnea or cough. Gastrointestinal:   Negative for abdominal pain, vomiting and diarrhea.  Musculoskeletal:   Negative for focal pain or swelling All other systems reviewed and are negative except as documented above in ROS and HPI.  ____________________________________________   PHYSICAL EXAM:  VITAL SIGNS: ED Triage Vitals  Enc Vitals Group     BP 07/12/17 1711 (!) 96/40     Pulse Rate 07/12/17 1830 (!) 104     Resp --      Temp --      Temp src --      SpO2 07/12/17 1830 95 %     Weight 07/12/17 1555 176 lb (79.8 kg)  Height 07/12/17 1555 5\' 3"  (1.6 m)     Head Circumference --      Peak Flow --      Pain Score 07/12/17 2022 9     Pain Loc --      Pain Edu? --      Excl. in [place]? --     Vital signs reviewed, nursing assessments reviewed.   Constitutional:   Alert and oriented. Well appearing and in no distress. Eyes:   No scleral icterus.  EOMI. No nystagmus. No conjunctival pallor. PERRL. ENT   Head:   Normocephalic and atraumatic.   Nose:   No congestion/rhinnorhea.    Mouth/Throat:   dry mucous membranes, no pharyngeal erythema. No peritonsillar mass.    Neck:   No meningismus. Full ROM. Hematological/Lymphatic/Immunilogical:   No cervical lymphadenopathy. Cardiovascular:   tachycardia heart rate 105. Symmetric bilateral radial and DP pulses.  No murmurs.  Respiratory:   Normal respiratory effort without tachypnea/retractions. Breath sounds are clear and equal bilaterally. No wheezes/rales/rhonchi. Gastrointestinal:   Soft and nontender. Non distended. There is no CVA tenderness.  No rebound, rigidity, or guarding. Genitourinary:   deferred Musculoskeletal:   Normal range of motion in all extremities. No joint effusions.  No lower extremity tenderness.  No  edema. Neurologic:   Normal speech and language.  Motor grossly intact. No gross focal neurologic deficits are appreciated.  Skin:    Skin is warm, dry and intact. No rash noted.  No petechiae, purpura, or bullae.  ____________________________________________    LABS (pertinent positives/negatives) (all labs ordered are listed, but only abnormal results are displayed) Labs Reviewed  CBC - Abnormal; Notable for the following:       Result Value   Hemoglobin 10.6 (*)    HCT 33.1 (*)    MCV 79.7 (*)    MCH 25.6 (*)    RDW 19.1 (*)    Platelets 112 (*)    All other components within normal limits  COMPREHENSIVE METABOLIC PANEL - Abnormal; Notable for the following:    Sodium 132 (*)    Chloride 96 (*)    Glucose, Bld 369 (*)    Creatinine, Ser 1.31 (*)    Albumin 3.4 (*)    AST 96 (*)    Alkaline Phosphatase 156 (*)    Total Bilirubin 1.5 (*)    GFR calc non Af Amer 47 (*)    GFR calc Af Amer 55 (*)    All other components within normal limits  LIPASE, BLOOD - Abnormal; Notable for the following:    Lipase 83 (*)    All other components within normal limits  TSH - Abnormal; Notable for the following:    TSH 5.166 (*)    All other components within normal limits  T4, FREE   ____________________________________________   EKG    ____________________________________________    RADIOLOGY  No results found.  ____________________________________________   PROCEDURES Procedures  ____________________________________________   INITIAL IMPRESSION / ASSESSMENT AND PLAN / ED COURSE  Pertinent labs & imaging results that were available during my care of the patient were reviewed by me and considered in my medical decision making (see chart for details).  As part of my medical decision making, I reviewed the following data within the [facility] prior ED records, historical medical records, imaging reports and lab results.   patient presents with  evidence of dehydration with creatinine elevation to 1.3 from a baseline of 0.7. Clinically she appears dehydrated with  low blood pressure and fast heart rate, likely secondary to furosemide use. We'll give aggressive IV fluid hydration and reassess.  Clinical Course as of Jul 12 2126  Nancy Fetter Jul 12, 2017  1749 EMR reviewed with previous labs, unremarkable csf panel. Baseline creatinine about 0.7.  [PS]    Clinical Course User Index [PS] Carrie Mew, MD     ----------------------------------------- 9:30 PM on 07/12/2017 -----------------------------------------  After 2 L IV fluid bolus, blood pressures improved, heart rate is improved. Orthostatics are normal. Patient feels better. Counseled patient to discontinue the furosemide especially since it is not a critical medication for something like volume overload or heart failure, but for IBS symptoms. Follow up with GI and/or primary care this week for continued reassessment. I expect that without the diuretic effect, her symptoms were rapidly normalized. Low suspicion for sepsis or any other underlying infectious process such as pneumonia or urinary tract infection or soft tissue infection. CSF studies reviewed, no evidence of meningitis. I doubt severe hypothyroidism or adrenal insufficiency. I doubt a cardiopulmonary cause such as ACS or PE.  ____________________________________________   FINAL CLINICAL IMPRESSION(S) / ED DIAGNOSES  Final diagnoses:  Dehydration  Adverse effect of loop diuretic, initial encounter      New Prescriptions   No medications on file     Portions of this note were generated with dragon dictation software. Dictation errors may occur despite best attempts at proofreading.    Carrie Mew, MD 07/12/17 2131

## 2017-07-12 NOTE — ED Notes (Signed)
Meal tray provided per MD order.

## 2017-07-12 NOTE — ED Notes (Signed)
Pt c/o hypotension and headache starting today. Pt reports seen in ED recently for headache. Takes BP at home. Given IVF bolus x2 due to hypotension per MD order. Pt in NAD. Pt A&Ox4.

## 2017-07-13 ENCOUNTER — Encounter: Payer: Self-pay | Admitting: Internal Medicine

## 2017-07-14 ENCOUNTER — Ambulatory Visit (INDEPENDENT_AMBULATORY_CARE_PROVIDER_SITE_OTHER): Payer: BLUE CROSS/BLUE SHIELD | Admitting: Internal Medicine

## 2017-07-14 ENCOUNTER — Encounter: Payer: Self-pay | Admitting: Internal Medicine

## 2017-07-14 VITALS — BP 118/60 | HR 98 | Temp 98.6°F | Ht 63.0 in | Wt 177.0 lb

## 2017-07-14 DIAGNOSIS — N289 Disorder of kidney and ureter, unspecified: Secondary | ICD-10-CM | POA: Diagnosis not present

## 2017-07-14 DIAGNOSIS — J029 Acute pharyngitis, unspecified: Secondary | ICD-10-CM | POA: Diagnosis not present

## 2017-07-14 DIAGNOSIS — D649 Anemia, unspecified: Secondary | ICD-10-CM

## 2017-07-14 DIAGNOSIS — I10 Essential (primary) hypertension: Secondary | ICD-10-CM | POA: Diagnosis not present

## 2017-07-14 MED ORDER — LOSARTAN POTASSIUM 50 MG PO TABS: 50.0000 mg | ORAL_TABLET | Freq: Every day | ORAL | 5 refills | Status: DC

## 2017-07-14 MED ORDER — AMOXICILLIN 875 MG PO TABS: 875.0000 mg | ORAL_TABLET | Freq: Two times a day (BID) | ORAL | 0 refills | Status: AC

## 2017-07-14 NOTE — Progress Notes (Signed)
Date:  07/14/2017   Name:  Amber Odonnell   DOB:  1970/11/28   MRN:  [id number]   Chief Complaint: Sore Throat (X 2 days but worse this morning. Gargled with salt water. Feels connected to right ear ringing. ) and Hypertension (Wants to discuss if supposed to still take losartan. Patient has continued taking furosemide after Er told her to stop. She stated she was confused because so much is going on . ) Patient comes here for follow-up from the emergency room. She's been to the emergency room 3 times in the past week and a half. Originally went for severe headache on 2 occasions. The headaches seem to be somewhat improved but still persistent. When I saw her several days ago her blood pressures were running persistently low and we stopped nadolol. On 07/12/2017 and patient went back to the emergency room feeling weak and lightheaded. Her BUN and creatinine were elevated. Her hemoglobin was slightly low at 10. He felt that she was dehydrated and gave her IV fluids. At her last visit to the ER she also complained of sore throat which was not addressed. This continues to be sore today with some pressure into her right ear. No fever or chills. She has some vague right lateral abdominal discomfort with ongoing constipation. She failed lactulose for constipation. Currently using no medications routinely.  Lab Results  Component Value Date   CREATININE 1.31 (H) 07/12/2017   BUN 14 07/12/2017   NA 132 (L) 07/12/2017   K 3.7 07/12/2017   CL 96 (L) 07/12/2017   CO2 23 07/12/2017   Lab Results  Component Value Date   WBC 6.9 07/12/2017   HGB 10.6 (L) 07/12/2017   HCT 33.1 (L) 07/12/2017   MCV 79.7 (L) 07/12/2017   PLT 112 (L) 07/12/2017    Review of Systems  Constitutional: Positive for fatigue. Negative for chills, fever and unexpected weight change.  HENT: Positive for sore throat. Negative for trouble swallowing.   Respiratory: Negative for chest tightness and shortness of breath.     Cardiovascular: Negative for chest pain.  Gastrointestinal: Positive for abdominal distention, abdominal pain and constipation.  Genitourinary: Negative for difficulty urinating.  Neurological: Positive for tremors and headaches. Negative for dizziness and syncope.    Patient Active Problem List   Diagnosis Date Noted  . Enlarged pituitary gland ([place]) 07/10/2017  . Headache syndrome 07/10/2017  . Secondary esophageal varices with bleeding ([place])   . Vaginal flatus 03/13/2017  . Eye discharge 03/13/2017  . Esophageal varices determined by endoscopy ([place]) 03/11/2017  . Abnormal feces   . Polyp of sigmoid colon   . Gastritis without bleeding   . Environmental and seasonal allergies 01/21/2017  . Type 2 diabetes mellitus without complication, with long-term current use of insulin ([place]) 01/21/2017  . Hypothyroidism due to acquired atrophy of thyroid 01/21/2017  . Hyperlipidemia associated with type 2 diabetes mellitus ([place]) 01/21/2017  . Irritable bowel syndrome with constipation 01/21/2017  . Obstructive sleep apnea syndrome 01/21/2017  . Thrombocytopenia ([place])   . Splenomegaly   . Portal hypertension ([place])   . Obesity (BMI 30-39.9)   . Essential hypertension   . Hyperlipidemia   . Generalized anxiety disorder   . Hepatic cirrhosis ([place])   . Chronic gastritis   . Arthritis     Prior to Admission medications   Medication Sig Start Date End Date Taking? Authorizing Provider  B Complex Vitamins (B-COMPLEX/B-12 SL) Place under the tongue every morning.   Yes  [provider]  BD PEN NEEDLE NANO U/F 32G X 4 MM MISC Inject 1 each into the skin 4 (four) times daily. 01/09/17  Yes [provider]  empagliflozin (JARDIANCE) 10 MG TABS tablet Take 10 mg by mouth daily.   Yes [provider]  fluticasone (FLONASE) 50 MCG/ACT nasal spray Place 2 sprays into both nostrils daily.   Yes [provider]  furosemide (LASIX) 40 MG tablet Take 1 tablet (40 mg  total) by mouth daily. 04/10/17 04/10/18 Yes Wohl, Darren, MD  insulin aspart (NOVOLOG FLEXPEN) 100 UNIT/ML FlexPen Inject 60 Units into the skin 3 (three) times daily with meals. 06/09/17  Yes Glean Hess, MD  insulin aspart protamine- aspart (NOVOLOG MIX 70/30) (70-30) 100 UNIT/ML injection Inject 60 Units into the skin 3 (three) times daily.   Yes [provider]  Insulin Degludec (TRESIBA FLEXTOUCH [place]) Inject 92 Units into the skin daily.    Yes [provider]  lactulose (CEPHULAC) 20 g packet Take 20 g by mouth daily.   Yes [provider]  levothyroxine (SYNTHROID, LEVOTHROID) 50 MCG tablet Take 1 tablet (50 mcg total) by mouth daily before breakfast. 04/20/17  Yes Glean Hess, MD  loratadine (CLARITIN) 10 MG tablet Take 1 tablet (10 mg total) by mouth daily. 04/20/17  Yes Glean Hess, MD  losartan (COZAAR) 50 MG tablet Take 1 tablet (50 mg total) by mouth daily. May take 1/2 twice a day 04/22/17  Yes Glean Hess, MD  MEGARED OMEGA-3 KRILL OIL PO Take by mouth.   Yes [provider]  montelukast (SINGULAIR) 10 MG tablet Take 1 tablet (10 mg total) by mouth at bedtime. 04/20/17  Yes Glean Hess, MD  Multiple Vitamins-Minerals (ALIVE ONCE DAILY WOMENS 50+ PO) Take by mouth.   Yes [provider]  pantoprazole (PROTONIX) 40 MG tablet Take 1 tablet (40 mg total) by mouth daily. 04/16/17  Yes [name], MD  rosuvastatin (CRESTOR) 10 MG tablet Take 10 mg by mouth daily.   Yes [provider]  sertraline (ZOLOFT) 50 MG tablet TAKE 1 AND 1/2 TABLETS(75 MG) BY MOUTH DAILY Patient taking differently: 1.5 - 2 tabs 06/05/17  Yes Glean Hess, MD    Allergies  Allergen Reactions  . Hydrocodone Other (See Comments)    thrush    Past Surgical History:  Procedure Laterality Date  . ESOPHAGOGASTRODUODENOSCOPY (EGD) WITH PROPOFOL N/A 02/26/2017   Procedure: ESOPHAGOGASTRODUODENOSCOPY (EGD) WITH Banding;  Surgeon: [name], MD;  Location: [place];  Service: Endoscopy;  Laterality: N/A;  . ESOPHAGOGASTRODUODENOSCOPY (EGD) WITH PROPOFOL N/A 04/10/2017   Procedure: ESOPHAGOGASTRODUODENOSCOPY (EGD) WITH PROPOFOL;  Surgeon: [name], MD;  Location: [place];  Service: Endoscopy;  Laterality: N/A;  Diabetic  sleep apnea  with banding  . TONSILLECTOMY    . TOTAL VAGINAL HYSTERECTOMY     endometriosis    Social History  Substance Use Topics  . Smoking status: Never Smoker  . Smokeless tobacco: Never Used  . Alcohol use No     Medication list has been reviewed and updated.  PHQ 2/9 Scores 07/10/2017  PHQ - 2 Score 0    Physical Exam  Constitutional: She is oriented to person, place, and time. She appears well-developed. No distress.  HENT:  Head: Normocephalic and atraumatic.  Right Ear: Tympanic membrane and ear canal normal.  Left Ear: Tympanic membrane and ear canal normal.  Nose: Right sinus exhibits no maxillary sinus tenderness. Left sinus  exhibits no maxillary sinus tenderness.  Mouth/Throat: Posterior oropharyngeal erythema present. No oropharyngeal exudate or posterior oropharyngeal edema.  Neck: Normal range of motion. Neck supple.  Cardiovascular: Normal rate, regular rhythm and normal heart sounds.   Pulmonary/Chest: Effort normal and breath sounds normal. No respiratory distress. She has no wheezes. She has no rhonchi.  Abdominal: She exhibits distension. She exhibits no fluid wave. There is tenderness in the right lower quadrant. There is no rigidity and no guarding.  Musculoskeletal: Normal range of motion.  Neurological: She is alert and oriented to person, place, and time.  Skin: Skin is warm and dry. No rash noted.  Psychiatric: She has a normal mood and affect. Her behavior is normal. Thought content normal.  Nursing note and vitals reviewed.   BP 118/60   Pulse 98   Temp 98.6 F (37 C) (Oral)   Ht 5\' 3"  (1.6 m)   Wt 177 lb (80.3 kg)   SpO2 99%    BMI 31.35 kg/m   Assessment and Plan: 1. Essential hypertension Still low but improved off of nadolol Continue losartan and lasix - losartan (COZAAR) 50 MG tablet; Take 1 tablet (50 mg total) by mouth daily. May take 1/2 twice a day  Dispense: 30 tablet; Refill: 5  2. Pharyngitis, unspecified etiology - amoxicillin (AMOXIL) 875 MG tablet; Take 1 tablet (875 mg total) by mouth 2 (two) times daily.  Dispense: 20 tablet; Refill: 0  3. Renal insufficiency Recheck labs; continue to push fluids - Basic metabolic panel  4. Anemia, unspecified type Recheck for worsening anemia - CBC with Differential/Platelet   Meds ordered this encounter  Medications  . losartan (COZAAR) 50 MG tablet    Sig: Take 1 tablet (50 mg total) by mouth daily. May take 1/2 twice a day    Dispense:  30 tablet    Refill:  5  . amoxicillin (AMOXIL) 875 MG tablet    Sig: Take 1 tablet (875 mg total) by mouth 2 (two) times daily.    Dispense:  20 tablet    Refill:  0    Partially dictated using Editor, commissioning. Any errors are unintentional.  Halina Maidens, MD [facility]  07/14/2017

## 2017-07-15 ENCOUNTER — Ambulatory Visit: Payer: BLUE CROSS/BLUE SHIELD | Admitting: Internal Medicine

## 2017-07-15 LAB — CBC WITH DIFFERENTIAL/PLATELET
Basophils Absolute: 0 10*3/uL (ref 0.0–0.2)
Basos: 0 %
EOS (ABSOLUTE): 0.2 10*3/uL (ref 0.0–0.4)
EOS: 3 %
HEMOGLOBIN: 9.7 g/dL — AB (ref 11.1–15.9)
Hematocrit: 31.5 % — ABNORMAL LOW (ref 34.0–46.6)
IMMATURE GRANS (ABS): 0 10*3/uL (ref 0.0–0.1)
IMMATURE GRANULOCYTES: 0 %
LYMPHS ABS: 1 10*3/uL (ref 0.7–3.1)
LYMPHS: 16 %
MCH: 24.9 pg — AB (ref 26.6–33.0)
MCHC: 30.8 g/dL — ABNORMAL LOW (ref 31.5–35.7)
MCV: 81 fL (ref 79–97)
MONOCYTES: 10 %
Monocytes Absolute: 0.6 10*3/uL (ref 0.1–0.9)
Neutrophils Absolute: 4.1 10*3/uL (ref 1.4–7.0)
Neutrophils: 71 %
Platelets: 110 10*3/uL — ABNORMAL LOW (ref 150–379)
RBC: 3.9 x10E6/uL (ref 3.77–5.28)
RDW: 18.3 % — ABNORMAL HIGH (ref 12.3–15.4)
WBC: 5.9 10*3/uL (ref 3.4–10.8)

## 2017-07-15 LAB — BASIC METABOLIC PANEL
BUN / CREAT RATIO: 7 — AB (ref 9–23)
BUN: 6 mg/dL (ref 6–24)
CO2: 21 mmol/L (ref 20–29)
CREATININE: 0.85 mg/dL (ref 0.57–1.00)
Calcium: 8.9 mg/dL (ref 8.7–10.2)
Chloride: 100 mmol/L (ref 96–106)
GFR calc Af Amer: 94 mL/min/{1.73_m2} (ref >59)
GFR calc non Af Amer: 81 mL/min/{1.73_m2} (ref >59)
GLUCOSE: 317 mg/dL — AB (ref 65–99)
Potassium: 3.7 mmol/L (ref 3.5–5.2)
Sodium: 136 mmol/L (ref 134–144)

## 2017-07-17 ENCOUNTER — Ambulatory Visit: Payer: BLUE CROSS/BLUE SHIELD | Admitting: Internal Medicine

## 2017-07-22 ENCOUNTER — Encounter: Payer: Self-pay | Admitting: *Deleted

## 2017-07-22 ENCOUNTER — Other Ambulatory Visit
Admission: RE | Admit: 2017-07-22 | Discharge: 2017-07-22 | Disposition: A | Discharge status: Home / Self Care | Payer: BLUE CROSS/BLUE SHIELD | Source: Ambulatory Visit | Attending: Gastroenterology | Admitting: Gastroenterology

## 2017-07-22 ENCOUNTER — Encounter: Payer: Self-pay | Admitting: Gastroenterology

## 2017-07-22 ENCOUNTER — Other Ambulatory Visit: Payer: Self-pay

## 2017-07-22 ENCOUNTER — Ambulatory Visit (INDEPENDENT_AMBULATORY_CARE_PROVIDER_SITE_OTHER): Payer: BLUE CROSS/BLUE SHIELD | Admitting: Gastroenterology

## 2017-07-22 VITALS — BP 130/60 | HR 98 | Temp 98.4°F | Ht 63.0 in | Wt 180.0 lb

## 2017-07-22 DIAGNOSIS — R188 Other ascites: Secondary | ICD-10-CM | POA: Insufficient documentation

## 2017-07-22 DIAGNOSIS — K7460 Unspecified cirrhosis of liver: Secondary | ICD-10-CM | POA: Diagnosis not present

## 2017-07-22 DIAGNOSIS — D649 Anemia, unspecified: Secondary | ICD-10-CM

## 2017-07-22 LAB — COMPREHENSIVE METABOLIC PANEL
ALBUMIN: 2.8 g/dL — AB (ref 3.5–5.0)
ALK PHOS: 209 U/L — AB (ref 38–126)
ALT: 28 U/L (ref 14–54)
AST: 94 U/L — AB (ref 15–41)
Anion gap: 10 (ref 5–15)
BUN: <5 mg/dL — ABNORMAL LOW (ref 6–20)
CALCIUM: 7.8 mg/dL — AB (ref 8.9–10.3)
CHLORIDE: 103 mmol/L (ref 101–111)
CO2: 24 mmol/L (ref 22–32)
Creatinine, Ser: 0.66 mg/dL (ref 0.44–1.00)
GFR calc non Af Amer: >60 mL/min (ref >60)
GLUCOSE: 165 mg/dL — AB (ref 65–99)
Potassium: 3.3 mmol/L — ABNORMAL LOW (ref 3.5–5.1)
SODIUM: 137 mmol/L (ref 135–145)
Total Bilirubin: 1.9 mg/dL — ABNORMAL HIGH (ref 0.3–1.2)
Total Protein: 6.8 g/dL (ref 6.5–8.1)

## 2017-07-22 NOTE — Progress Notes (Signed)
Primary Care Physician: Glean Hess, MD  Primary Gastroenterologist:  Dr. [name]  No chief complaint on file.   HPI: Amber Odonnell is a 46 y.o. female here for follow-up of her cirrhosis due to fatty liver and irritable bowel syndrome with constipation predominance. At previous visit patient was taking only one third of the prescribed lactulose and was still having constipation. She was also having early satiety at her last visit. The patient was told that she should take her lactulose 15 mL twice a day to see if that helps her. The patient states that when she was tried on Linzess that caused her to have a lot of gas. The patient reports that she was in the emergency room recently with weakness and was found to have low blood pressure. The patient does not know what medications were stopped but she states that some of her medications were stopped. The patient was on diuretics for her ascites and now she reports that she is having right upper quadrant pain and more distention of her stomach. Found to have a drop in her hemoglobin and a baseline of around 12 which has gone down to 9.7.  Current Outpatient Prescriptions  Medication Sig Dispense Refill  . amoxicillin (AMOXIL) 875 MG tablet Take 1 tablet (875 mg total) by mouth 2 (two) times daily. 20 tablet 0  . B Complex Vitamins (B-COMPLEX/B-12 SL) Place under the tongue every morning.    . BD PEN NEEDLE NANO U/F 32G X 4 MM MISC Inject 1 each into the skin 4 (four) times daily.  7  . Continuous Blood Gluc Receiver (FREESTYLE LIBRE READER) Amber Use 1 Device once daily.    . Continuous Blood Gluc Sensor (FREESTYLE LIBRE SENSOR SYSTEM) MISC Use 1 kit every 10 (ten) days.    . diclofenac (VOLTAREN) 75 MG EC tablet Take by mouth.    . empagliflozin (JARDIANCE) 10 MG TABS tablet Take 10 mg by mouth daily.    . fluticasone (FLONASE) 50 MCG/ACT nasal spray Place 2 sprays into both nostrils daily.    . furosemide (LASIX) 40 MG tablet Take  1 tablet (40 mg total) by mouth daily. 30 tablet 11  . insulin aspart (NOVOLOG FLEXPEN) 100 UNIT/ML FlexPen Inject 60 Units into the skin 3 (three) times daily with meals. 15 mL 5  . insulin aspart protamine- aspart (NOVOLOG MIX 70/30) (70-30) 100 UNIT/ML injection Inject 60 Units into the skin 3 (three) times daily.    . Insulin Degludec (TRESIBA FLEXTOUCH [place]) Inject 92 Units into the skin daily.     Marland Kitchen lactulose (CEPHULAC) 20 g packet Take 20 g by mouth daily.    Marland Kitchen levothyroxine (SYNTHROID, LEVOTHROID) 50 MCG tablet Take 1 tablet (50 mcg total) by mouth daily before breakfast. 30 tablet 5  . loratadine (CLARITIN) 10 MG tablet Take 1 tablet (10 mg total) by mouth daily. 30 tablet 5  . losartan (COZAAR) 50 MG tablet Take 1 tablet (50 mg total) by mouth daily. May take 1/2 twice a day 30 tablet 5  . MEGARED OMEGA-3 KRILL OIL PO Take by mouth.    . montelukast (SINGULAIR) 10 MG tablet Take 1 tablet (10 mg total) by mouth at bedtime. 30 tablet 5  . Multiple Vitamins-Minerals (ALIVE ONCE DAILY WOMENS 50+ PO) Take by mouth.    . nadolol (CORGARD) 20 MG tablet Take by mouth.    . pantoprazole (PROTONIX) 40 MG tablet Take 1 tablet (40 mg total) by mouth daily. 30 tablet  6  . rosuvastatin (CRESTOR) 10 MG tablet Take 10 mg by mouth daily.    . sertraline (ZOLOFT) 50 MG tablet TAKE 1 AND 1/2 TABLETS(75 MG) BY MOUTH DAILY (Patient taking differently: 1.5 - 2 tabs) 45 tablet 2   No current facility-administered medications for this visit.     Allergies as of 07/22/2017 - Review Complete 07/14/2017  Allergen Reaction Noted  . Hydrocodone Other (See Comments) 01/21/2017    ROS:  General: Negative for anorexia, weight loss, fever, chills, fatigue, weakness. ENT: Negative for hoarseness, difficulty swallowing , nasal congestion. CV: Negative for chest pain, angina, palpitations, dyspnea on exertion, peripheral edema.  Respiratory: Negative for dyspnea at rest, dyspnea on exertion, cough, sputum,  wheezing.  GI: See history of present illness. GU:  Negative for dysuria, hematuria, urinary incontinence, urinary frequency, nocturnal urination.  Endo: Negative for unusual weight change.    Physical Examination:   There were no vitals taken for this visit.  General: Well-nourished, well-developed in no acute distress.  Eyes: No icterus. Conjunctivae pink. Mouth: Oropharyngeal mucosa moist and pink , no lesions erythema or exudate. Lungs: Clear to auscultation bilaterally. Non-labored. Heart: Regular rate and rhythm, no murmurs rubs or gallops.  Abdomen: Bowel sounds are normal, nontender, positive distention with shifting dullness and fluid wave, no hepatosplenomegaly or masses, no abdominal bruits or hernia , no rebound or guarding.   Extremities: No lower extremity edema. No clubbing or deformities. Neuro: Alert and oriented x 3.  Grossly intact. Skin: Warm and dry, no jaundice.   Psych: Alert and cooperative, normal mood and affect.  Labs:    Imaging Studies: Ct Head Wo Contrast  Result Date: 07/05/2017 CLINICAL DATA:  Frontal migraine headache for 2 days. No acute injury. EXAM: CT HEAD WITHOUT CONTRAST TECHNIQUE: Contiguous axial images were obtained from the base of the skull through the vertex without intravenous contrast. COMPARISON:  None. FINDINGS: Brain: There is no evidence of acute intracranial hemorrhage, mass lesion, brain edema or extra-axial fluid collection. The ventricles and subarachnoid spaces are appropriately sized for age. There is no CT evidence of acute cortical infarction. The pituitary gland is prominent, measuring 11 mm in height. A mildly prominent cisterna magna noted. Vascular: No hyperdense vessel identified. Skull: Negative for fracture or focal lesion. Sinuses/Orbits: Mild mucosal thickening in the left division of the sphenoid sinus. The visualized paranasal sinuses and mastoid air cells are otherwise clear. No orbital abnormalities are seen. Other:  None. IMPRESSION: 1. No acute intracranial findings identified. 2. Prominent pituitary for age (assuming non pregnancy). Recommend correlation with endocrine function tests and 6-12 month follow-up pituitary MRI. This recommendation follows ACR consensus guidelines: White Paper of the ACR Incidental Findings Committee. Electronically Signed   By: Richardean Sale M.D.   On: 07/05/2017 13:54   Mr Angiogram Head Wo Contrast  Result Date: 07/08/2017 CLINICAL DATA:  Thunderclap headache EXAM: MRI HEAD WITHOUT CONTRAST MRA HEAD WITHOUT CONTRAST MRA NECK WITHOUT CONTRAST TECHNIQUE: Multiplanar, multiecho pulse sequences of the brain and surrounding structures were obtained without intravenous contrast. Angiographic images of the Circle of Willis were obtained using MRA technique without intravenous contrast. Angiographic images of the neck were obtained using MRA technique without intravenous contrast. Carotid stenosis measurements (when applicable) are obtained utilizing NASCET criteria, using the distal internal carotid diameter as the denominator. COMPARISON:  Head CT 07/05/2017 FINDINGS: MRI HEAD FINDINGS Brain: Enlarged pituitary gland, measuring 13 mm in craniocaudal dimension. No focal diffusion restriction to indicate acute infarct. No intraparenchymal hemorrhage. The brain  parenchymal signal is normal. No mass lesion. No chronic microhemorrhage or cerebral amyloid angiopathy. No hydrocephalus, age advanced atrophy or lobar predominant volume loss. No dural abnormality or extra-axial collection. Skull and upper cervical spine: The visualized skull base, calvarium, upper cervical spine and extracranial soft tissues are normal. Sinuses/Orbits: No fluid levels or advanced mucosal thickening. No mastoid effusion. Normal orbits. MRA HEAD FINDINGS Intracranial internal carotid arteries: Normal. Anterior cerebral arteries: Normal. Middle cerebral arteries: Normal. Posterior communicating arteries: Absent. Posterior  cerebral arteries: Normal. Basilar artery: Normal. Vertebral arteries: Left dominant. Normal. Superior cerebellar arteries: Normal. Anterior inferior cerebellar arteries: Not clearly visualized, which is not uncommon. Posterior inferior cerebellar arteries: Normal. MRA NECK FINDINGS Normal carotid and vertebral arterial systems. IMPRESSION: 1. Enlarged pituitary gland for age, as previously described. No mass effect on the optic chiasm or optic nerves. Correlation with endocrine function tests is recommended. Recommend correlation with endocrine function tests and 6-12 month follow-up pituitary MRI. This recommendation follows ACR consensus guidelines: White Paper of the ACR Incidental Findings Committee. 2. Otherwise normal MRI of the brain. 3. Normal MRA of the head and neck. 1. Electronically Signed   By: Ulyses Jarred M.D.   On: 07/08/2017 18:07   Mr Jodene Nam Neck Wo Contrast  Result Date: 07/08/2017 CLINICAL DATA:  Thunderclap headache EXAM: MRI HEAD WITHOUT CONTRAST MRA HEAD WITHOUT CONTRAST MRA NECK WITHOUT CONTRAST TECHNIQUE: Multiplanar, multiecho pulse sequences of the brain and surrounding structures were obtained without intravenous contrast. Angiographic images of the Circle of Willis were obtained using MRA technique without intravenous contrast. Angiographic images of the neck were obtained using MRA technique without intravenous contrast. Carotid stenosis measurements (when applicable) are obtained utilizing NASCET criteria, using the distal internal carotid diameter as the denominator. COMPARISON:  Head CT 07/05/2017 FINDINGS: MRI HEAD FINDINGS Brain: Enlarged pituitary gland, measuring 13 mm in craniocaudal dimension. No focal diffusion restriction to indicate acute infarct. No intraparenchymal hemorrhage. The brain parenchymal signal is normal. No mass lesion. No chronic microhemorrhage or cerebral amyloid angiopathy. No hydrocephalus, age advanced atrophy or lobar predominant volume loss. No dural  abnormality or extra-axial collection. Skull and upper cervical spine: The visualized skull base, calvarium, upper cervical spine and extracranial soft tissues are normal. Sinuses/Orbits: No fluid levels or advanced mucosal thickening. No mastoid effusion. Normal orbits. MRA HEAD FINDINGS Intracranial internal carotid arteries: Normal. Anterior cerebral arteries: Normal. Middle cerebral arteries: Normal. Posterior communicating arteries: Absent. Posterior cerebral arteries: Normal. Basilar artery: Normal. Vertebral arteries: Left dominant. Normal. Superior cerebellar arteries: Normal. Anterior inferior cerebellar arteries: Not clearly visualized, which is not uncommon. Posterior inferior cerebellar arteries: Normal. MRA NECK FINDINGS Normal carotid and vertebral arterial systems. IMPRESSION: 1. Enlarged pituitary gland for age, as previously described. No mass effect on the optic chiasm or optic nerves. Correlation with endocrine function tests is recommended. Recommend correlation with endocrine function tests and 6-12 month follow-up pituitary MRI. This recommendation follows ACR consensus guidelines: White Paper of the ACR Incidental Findings Committee. 2. Otherwise normal MRI of the brain. 3. Normal MRA of the head and neck. 1. Electronically Signed   By: Ulyses Jarred M.D.   On: 07/08/2017 18:07   Mr Brain Wo Contrast  Result Date: 07/08/2017 CLINICAL DATA:  Thunderclap headache EXAM: MRI HEAD WITHOUT CONTRAST MRA HEAD WITHOUT CONTRAST MRA NECK WITHOUT CONTRAST TECHNIQUE: Multiplanar, multiecho pulse sequences of the brain and surrounding structures were obtained without intravenous contrast. Angiographic images of the Circle of Willis were obtained using MRA technique without intravenous contrast. Angiographic images of  the neck were obtained using MRA technique without intravenous contrast. Carotid stenosis measurements (when applicable) are obtained utilizing NASCET criteria, using the distal internal  carotid diameter as the denominator. COMPARISON:  Head CT 07/05/2017 FINDINGS: MRI HEAD FINDINGS Brain: Enlarged pituitary gland, measuring 13 mm in craniocaudal dimension. No focal diffusion restriction to indicate acute infarct. No intraparenchymal hemorrhage. The brain parenchymal signal is normal. No mass lesion. No chronic microhemorrhage or cerebral amyloid angiopathy. No hydrocephalus, age advanced atrophy or lobar predominant volume loss. No dural abnormality or extra-axial collection. Skull and upper cervical spine: The visualized skull base, calvarium, upper cervical spine and extracranial soft tissues are normal. Sinuses/Orbits: No fluid levels or advanced mucosal thickening. No mastoid effusion. Normal orbits. MRA HEAD FINDINGS Intracranial internal carotid arteries: Normal. Anterior cerebral arteries: Normal. Middle cerebral arteries: Normal. Posterior communicating arteries: Absent. Posterior cerebral arteries: Normal. Basilar artery: Normal. Vertebral arteries: Left dominant. Normal. Superior cerebellar arteries: Normal. Anterior inferior cerebellar arteries: Not clearly visualized, which is not uncommon. Posterior inferior cerebellar arteries: Normal. MRA NECK FINDINGS Normal carotid and vertebral arterial systems. IMPRESSION: 1. Enlarged pituitary gland for age, as previously described. No mass effect on the optic chiasm or optic nerves. Correlation with endocrine function tests is recommended. Recommend correlation with endocrine function tests and 6-12 month follow-up pituitary MRI. This recommendation follows ACR consensus guidelines: White Paper of the ACR Incidental Findings Committee. 2. Otherwise normal MRI of the brain. 3. Normal MRA of the head and neck. 1. Electronically Signed   By: Ulyses Jarred M.D.   On: 07/08/2017 18:07    Assessment and Plan:   Amber Odonnell is a 46 y.o. y/o female cirrhosis and ascites. The patient has been seen at [facility] for evaluation for liver transplant 2  months ago when she states she was told to follow-up in a year. The patient now comes in with right upper quadrant pain that may be a associate with her ascites which has worsened since the last time I seen her. The patient does not know what medication she is now taking 4 her ascites but states some of her diuretics and blood pressure medication was stopped in the ER. The patient will be set up for an EGD and colonoscopy due to her anemia. The patient has a history of esophageal varices with banding in the past. The patient will also be set up for right upper quadrant ultrasound and blood for alpha-fetoprotein and a CMP. The patient has been explained the plan and agrees with it.    [name], MD. Marval Regal   Note: This dictation was prepared with Dragon dictation along with smaller phrase technology. Any transcriptional errors that result from this process are unintentional.

## 2017-07-22 NOTE — Patient Instructions (Signed)
You are scheduled for a RUQ abdominal US at [facility], [place] location on Thursday, 07/23/17 @ 8:45am. Please arrive at 8:30am. You cannot have anything to eat or drink after midnight on Wednesday night.   If you need to reschedule this appointment for any reason, please contact central scheduling at [phone].

## 2017-07-23 ENCOUNTER — Ambulatory Visit
Admission: RE | Admit: 2017-07-23 | Discharge: 2017-07-23 | Disposition: A | Discharge status: Home / Self Care | Payer: BLUE CROSS/BLUE SHIELD | Source: Ambulatory Visit | Attending: Gastroenterology | Admitting: Gastroenterology

## 2017-07-23 DIAGNOSIS — R160 Hepatomegaly, not elsewhere classified: Secondary | ICD-10-CM | POA: Insufficient documentation

## 2017-07-23 DIAGNOSIS — K7460 Unspecified cirrhosis of liver: Secondary | ICD-10-CM | POA: Insufficient documentation

## 2017-07-23 DIAGNOSIS — R188 Other ascites: Secondary | ICD-10-CM | POA: Diagnosis not present

## 2017-07-23 LAB — AFP TUMOR MARKER: AFP, SERUM, TUMOR MARKER: 2.9 ng/mL (ref 0.0–8.3)

## 2017-07-23 NOTE — Discharge Instructions (Signed)
General Anesthesia, Adult, Care After °These instructions provide you with information about caring for yourself after your procedure. Your health care provider may also give you more specific instructions. Your treatment has been planned according to current medical practices, but problems sometimes occur. Call your health care provider if you have any problems or questions after your procedure. °What can I expect after the procedure? °After the procedure, it is common to have: °· Vomiting. °· A sore throat. °· Mental slowness. ° °It is common to feel: °· Nauseous. °· Cold or shivery. °· Sleepy. °· Tired. °· Sore or achy, even in parts of your body where you did not have surgery. ° °Follow these instructions at home: °For at least 24 hours after the procedure: °· Do not: °? Participate in activities where you could fall or become injured. °? Drive. °? Use heavy machinery. °? Drink alcohol. °? Take sleeping pills or medicines that cause drowsiness. °? Make important decisions or sign legal documents. °? Take care of children on your own. °· Rest. °Eating and drinking °· If you vomit, drink water, juice, or soup when you can drink without vomiting. °· Drink enough fluid to keep your urine clear or pale yellow. °· Make sure you have little or no nausea before eating solid foods. °· Follow the diet recommended by your health care provider. °General instructions °· Have a responsible adult stay with you until you are awake and alert. °· Return to your normal activities as told by your health care provider. Ask your health care provider what activities are safe for you. °· Take over-the-counter and prescription medicines only as told by your health care provider. °· If you smoke, do not smoke without supervision. °· Keep all follow-up visits as told by your health care provider. This is important. °Contact a health care provider if: °· You continue to have nausea or vomiting at home, and medicines are not helpful. °· You  cannot drink fluids or start eating again. °· You cannot urinate after 8-12 hours. °· You develop a skin rash. °· You have fever. °· You have increasing redness at the site of your procedure. °Get help right away if: °· You have difficulty breathing. °· You have chest pain. °· You have unexpected bleeding. °· You feel that you are having a life-threatening or urgent problem. °This information is not intended to replace advice given to you by your health care provider. Make sure you discuss any questions you have with your health care provider. °Document Released: 12/29/2000 Document Revised: 02/25/2016 Document Reviewed: 09/06/2015 °Elsevier Interactive Patient Education © 2018 Elsevier Inc. ° °

## 2017-07-24 ENCOUNTER — Ambulatory Visit: Payer: BLUE CROSS/BLUE SHIELD | Admitting: Anesthesiology

## 2017-07-24 ENCOUNTER — Telehealth: Payer: Self-pay

## 2017-07-24 ENCOUNTER — Encounter
Admission: RE | Disposition: A | Discharge status: Home / Self Care | Payer: Self-pay | Source: Ambulatory Visit | Attending: Gastroenterology

## 2017-07-24 ENCOUNTER — Ambulatory Visit
Admission: RE | Admit: 2017-07-24 | Discharge: 2017-07-24 | Disposition: A | Discharge status: Home / Self Care | Payer: BLUE CROSS/BLUE SHIELD | Source: Ambulatory Visit | Attending: Gastroenterology | Admitting: Gastroenterology

## 2017-07-24 DIAGNOSIS — J45909 Unspecified asthma, uncomplicated: Secondary | ICD-10-CM | POA: Insufficient documentation

## 2017-07-24 DIAGNOSIS — Z885 Allergy status to narcotic agent status: Secondary | ICD-10-CM | POA: Diagnosis not present

## 2017-07-24 DIAGNOSIS — Z538 Procedure and treatment not carried out for other reasons: Secondary | ICD-10-CM | POA: Insufficient documentation

## 2017-07-24 DIAGNOSIS — K581 Irritable bowel syndrome with constipation: Secondary | ICD-10-CM | POA: Diagnosis not present

## 2017-07-24 DIAGNOSIS — Z6831 Body mass index (BMI) 31.0-31.9, adult: Secondary | ICD-10-CM | POA: Insufficient documentation

## 2017-07-24 DIAGNOSIS — K7460 Unspecified cirrhosis of liver: Secondary | ICD-10-CM | POA: Insufficient documentation

## 2017-07-24 DIAGNOSIS — R161 Splenomegaly, not elsewhere classified: Secondary | ICD-10-CM | POA: Insufficient documentation

## 2017-07-24 DIAGNOSIS — K766 Portal hypertension: Secondary | ICD-10-CM | POA: Insufficient documentation

## 2017-07-24 DIAGNOSIS — Z794 Long term (current) use of insulin: Secondary | ICD-10-CM | POA: Diagnosis not present

## 2017-07-24 DIAGNOSIS — Z90710 Acquired absence of both cervix and uterus: Secondary | ICD-10-CM | POA: Insufficient documentation

## 2017-07-24 DIAGNOSIS — R0689 Other abnormalities of breathing: Secondary | ICD-10-CM | POA: Diagnosis not present

## 2017-07-24 DIAGNOSIS — D500 Iron deficiency anemia secondary to blood loss (chronic): Secondary | ICD-10-CM

## 2017-07-24 DIAGNOSIS — I8510 Secondary esophageal varices without bleeding: Secondary | ICD-10-CM

## 2017-07-24 DIAGNOSIS — I10 Essential (primary) hypertension: Secondary | ICD-10-CM | POA: Insufficient documentation

## 2017-07-24 DIAGNOSIS — I8500 Esophageal varices without bleeding: Secondary | ICD-10-CM | POA: Diagnosis not present

## 2017-07-24 DIAGNOSIS — E785 Hyperlipidemia, unspecified: Secondary | ICD-10-CM | POA: Insufficient documentation

## 2017-07-24 DIAGNOSIS — G4730 Sleep apnea, unspecified: Secondary | ICD-10-CM | POA: Diagnosis not present

## 2017-07-24 DIAGNOSIS — E039 Hypothyroidism, unspecified: Secondary | ICD-10-CM | POA: Insufficient documentation

## 2017-07-24 DIAGNOSIS — Z79899 Other long term (current) drug therapy: Secondary | ICD-10-CM | POA: Insufficient documentation

## 2017-07-24 DIAGNOSIS — E669 Obesity, unspecified: Secondary | ICD-10-CM | POA: Diagnosis not present

## 2017-07-24 DIAGNOSIS — Z9989 Dependence on other enabling machines and devices: Secondary | ICD-10-CM | POA: Insufficient documentation

## 2017-07-24 DIAGNOSIS — F411 Generalized anxiety disorder: Secondary | ICD-10-CM | POA: Insufficient documentation

## 2017-07-24 DIAGNOSIS — K219 Gastro-esophageal reflux disease without esophagitis: Secondary | ICD-10-CM | POA: Diagnosis not present

## 2017-07-24 DIAGNOSIS — E119 Type 2 diabetes mellitus without complications: Secondary | ICD-10-CM | POA: Insufficient documentation

## 2017-07-24 HISTORY — PX: COLONOSCOPY WITH PROPOFOL: SHX5780

## 2017-07-24 HISTORY — PX: ESOPHAGOGASTRODUODENOSCOPY (EGD) WITH PROPOFOL: SHX5813

## 2017-07-24 LAB — GLUCOSE, CAPILLARY
GLUCOSE-CAPILLARY: 84 mg/dL (ref 65–99)
GLUCOSE-CAPILLARY: 83 mg/dL (ref 65–99)

## 2017-07-24 SURGERY — COLONOSCOPY WITH PROPOFOL
Anesthesia: General

## 2017-07-24 MED ORDER — STERILE WATER FOR IRRIGATION IR SOLN
Status: DC | PRN
  Administered 2017-07-24: 09:00:00

## 2017-07-24 MED ORDER — LACTATED RINGERS IV SOLN
INTRAVENOUS | Status: DC
  Administered 2017-07-24: 09:00:00 via INTRAVENOUS

## 2017-07-24 MED ORDER — LIDOCAINE HCL (CARDIAC) 20 MG/ML IV SOLN
INTRAVENOUS | Status: DC | PRN
Start: 2017-07-24 — End: 2017-07-24
  Administered 2017-07-24: 50 mg via INTRAVENOUS

## 2017-07-24 MED ORDER — GLYCOPYRROLATE 0.2 MG/ML IJ SOLN
INTRAMUSCULAR | Status: DC | PRN
  Administered 2017-07-24: 0.1 mg via INTRAVENOUS

## 2017-07-24 MED ORDER — PROPOFOL 10 MG/ML IV BOLUS
INTRAVENOUS | Status: DC | PRN
  Administered 2017-07-24: 30 mg via INTRAVENOUS
  Administered 2017-07-24 (×3): 50 mg via INTRAVENOUS
  Administered 2017-07-24: 100 mg via INTRAVENOUS
  Administered 2017-07-24 (×2): 30 mg via INTRAVENOUS

## 2017-07-24 SURGICAL SUPPLY — 36 items
BALLN DILATOR 10-12 8 (BALLOONS)
BALLN DILATOR 12-15 8 (BALLOONS)
BALLN DILATOR 15-18 8 (BALLOONS)
BALLN DILATOR CRE 0-12 8 (BALLOONS)
BALLN DILATOR ESOPH 8 10 CRE (MISCELLANEOUS) IMPLANT
BALLOON DILATOR 12-15 8 (BALLOONS) IMPLANT
BALLOON DILATOR 15-18 8 (BALLOONS) IMPLANT
BALLOON DILATOR CRE 0-12 8 (BALLOONS) IMPLANT
BLOCK BITE 60FR ADLT L/F GRN (MISCELLANEOUS) ×2 IMPLANT
CANISTER SUCT 1200ML W/VALVE (MISCELLANEOUS) ×2 IMPLANT
CLIP HMST 235XBRD CATH ROT (MISCELLANEOUS) IMPLANT
CLIP RESOLUTION 360 11X235 (MISCELLANEOUS)
FCP ESCP3.2XJMB 240X2.8X (MISCELLANEOUS)
FORCEPS BIOP RAD 4 LRG CAP 4 (CUTTING FORCEPS) IMPLANT
FORCEPS BIOP RJ4 240 W/NDL (MISCELLANEOUS)
FORCEPS ESCP3.2XJMB 240X2.8X (MISCELLANEOUS) IMPLANT
GOWN CVR UNV OPN BCK APRN NK (MISCELLANEOUS) ×2 IMPLANT
GOWN ISOL THUMB LOOP REG UNIV (MISCELLANEOUS) ×2
INJECTOR VARIJECT VIN23 (MISCELLANEOUS) IMPLANT
KIT DEFENDO VALVE AND CONN (KITS) IMPLANT
KIT ENDO PROCEDURE OLY (KITS) ×2 IMPLANT
LIGATOR MULTIBAND 6SHOOTER MBL (MISCELLANEOUS) ×2 IMPLANT
MARKER SPOT ENDO TATTOO 5ML (MISCELLANEOUS) IMPLANT
PAD GROUND ADULT SPLIT (MISCELLANEOUS) IMPLANT
PROBE APC STR FIRE (PROBE) IMPLANT
RETRIEVER NET PLAT FOOD (MISCELLANEOUS) IMPLANT
RETRIEVER NET ROTH 2.5X230 LF (MISCELLANEOUS) IMPLANT
SNARE SHORT THROW 13M SML OVAL (MISCELLANEOUS) IMPLANT
SNARE SHORT THROW 30M LRG OVAL (MISCELLANEOUS) IMPLANT
SNARE SNG USE RND 15MM (INSTRUMENTS) IMPLANT
SPOT EX ENDOSCOPIC TATTOO (MISCELLANEOUS)
SYR INFLATION 60ML (SYRINGE) IMPLANT
TRAP ETRAP POLY (MISCELLANEOUS) IMPLANT
VARIJECT INJECTOR VIN23 (MISCELLANEOUS)
WATER STERILE IRR 250ML POUR (IV SOLUTION) ×2 IMPLANT
WIRE CRE 18-20MM 8CM F G (MISCELLANEOUS) IMPLANT

## 2017-07-24 NOTE — H&P (Signed)
Amber Lame, MD [facility] [address], [place], [place] [address] Phone:[phone] Fax : [phone]  Primary Care Physician:  Glean Hess, MD Primary Gastroenterologist:  Dr. Allen Norris  Pre-Procedure History & Physical: HPI:  Amber Odonnell is a 46 y.o. female is here for an endoscopy.   Past Medical History:  Diagnosis Date  . Arthritis    of knee  . Asthma   . Chronic gastritis   . Cirrhosis of liver without ascites ([place])   . Diabetes mellitus without complication ([place])    type 2  . Dyspnea   . Generalized anxiety disorder   . GERD (gastroesophageal reflux disease)   . Hyperlipidemia   . Hypertension   . Hypothyroidism   . Irritable bowel syndrome (IBS)    w/ constipation  . Obesity (BMI 30-39.9)   . Portal hypertension ([place])   . Sleep apnea    CPAP  . Splenomegaly   . Thrombocytopenia ([place])     Past Surgical History:  Procedure Laterality Date  . ESOPHAGOGASTRODUODENOSCOPY (EGD) WITH PROPOFOL N/A 02/26/2017   Procedure: ESOPHAGOGASTRODUODENOSCOPY (EGD) WITH Banding;  Surgeon: Amber Lame, MD;  Location: [place];  Service: Endoscopy;  Laterality: N/A;  . ESOPHAGOGASTRODUODENOSCOPY (EGD) WITH PROPOFOL N/A 04/10/2017   Procedure: ESOPHAGOGASTRODUODENOSCOPY (EGD) WITH PROPOFOL;  Surgeon: Amber Lame, MD;  Location: [place];  Service: Endoscopy;  Laterality: N/A;  Diabetic  sleep apnea  with banding  . TONSILLECTOMY    . TOTAL VAGINAL HYSTERECTOMY     endometriosis    Prior to Admission medications   Medication Sig Start Date End Date Taking? Authorizing Provider  amoxicillin (AMOXIL) 875 MG tablet Take 1 tablet (875 mg total) by mouth 2 (two) times daily. 07/14/17 07/24/17  Glean Hess, MD  B Complex Vitamins (B-COMPLEX/B-12 SL) Place under the tongue every morning.    [provider]  BD PEN NEEDLE NANO U/F 32G X 4 MM MISC Inject 1 each into the skin 4 (four) times daily. 01/09/17   [provider]    Continuous Blood Gluc Receiver (FREESTYLE LIBRE READER) DEVI Use 1 Device once daily. 06/15/17   [provider]  Continuous Blood Gluc Sensor (FREESTYLE LIBRE SENSOR SYSTEM) MISC Use 1 kit every 10 (ten) days. 06/15/17   [provider]  diclofenac (VOLTAREN) 75 MG EC tablet Take by mouth.    [provider]  empagliflozin (JARDIANCE) 10 MG TABS tablet Take 10 mg by mouth daily.    [provider]  fluticasone (FLONASE) 50 MCG/ACT nasal spray Place 2 sprays into both nostrils daily.    [provider]  furosemide (LASIX) 40 MG tablet Take 1 tablet (40 mg total) by mouth daily. 04/10/17 04/10/18  Amber Lame, MD  insulin aspart (NOVOLOG FLEXPEN) 100 UNIT/ML FlexPen Inject 60 Units into the skin 3 (three) times daily with meals. 06/09/17   Glean Hess, MD  insulin aspart protamine- aspart (NOVOLOG MIX 70/30) (70-30) 100 UNIT/ML injection Inject 60 Units into the skin 3 (three) times daily.    [provider]  Insulin Degludec (TRESIBA FLEXTOUCH [place]) Inject 92 Units into the skin daily.     [provider]  lactulose (CEPHULAC) 20 g packet Take 20 g by mouth daily.    [provider]  levothyroxine (SYNTHROID, LEVOTHROID) 50 MCG tablet Take 1 tablet (50 mcg total) by mouth daily before breakfast. 04/20/17   Glean Hess, MD  loratadine (CLARITIN) 10 MG tablet Take 1 tablet (10 mg total) by  mouth daily. Patient not taking: Reported on 07/22/2017 04/20/17   Glean Hess, MD  losartan (COZAAR) 50 MG tablet Take 1 tablet (50 mg total) by mouth daily. May take 1/2 twice a day 07/14/17   Glean Hess, MD  [facility] OMEGA-3 KRILL OIL PO Take by mouth.    [provider]  montelukast (SINGULAIR) 10 MG tablet Take 1 tablet (10 mg total) by mouth at bedtime. 04/20/17   Glean Hess, MD  Multiple Vitamins-Minerals (ALIVE ONCE DAILY WOMENS 50+ PO) Take by mouth.    [provider]  nadolol (CORGARD) 20 MG  tablet Take by mouth.    [provider]  pantoprazole (PROTONIX) 40 MG tablet Take 1 tablet (40 mg total) by mouth daily. 04/16/17   Amber Lame, MD  rosuvastatin (CRESTOR) 10 MG tablet Take 10 mg by mouth daily.    [provider]  sertraline (ZOLOFT) 50 MG tablet TAKE 1 AND 1/2 TABLETS(75 MG) BY MOUTH DAILY Patient taking differently: 1.5 - 2 tabs 06/05/17   Glean Hess, MD    Allergies as of 07/22/2017 - Review Complete 07/22/2017  Allergen Reaction Noted  . Hydrocodone Other (See Comments) 01/21/2017    Family History  Problem Relation Age of Onset  . Breast cancer Mother   . Diabetes Mother   . Diabetes Father   . Hypertension Father   . Cancer Paternal Grandmother     Social History   Social History  . Marital status: Married    Spouse name: N/A  . Number of children: N/A  . Years of education: N/A   Occupational History  . Not on file.   Social History Main Topics  . Smoking status: Never Smoker  . Smokeless tobacco: Never Used  . Alcohol use No  . Drug use: No  . Sexual activity: No   Other Topics Concern  . Not on file   Social History Narrative  . No narrative on file    Review of Systems: See HPI, otherwise negative ROS  Physical Exam: Ht 5' 3"  (1.6 m)   Wt 180 lb (81.6 kg)   BMI 31.89 kg/m  General:   Alert,  pleasant and cooperative in NAD Head:  Normocephalic and atraumatic. Neck:  Supple; no masses or thyromegaly. Lungs:  Clear throughout to auscultation.    Heart:  Regular rate and rhythm. Abdomen:  Soft, nontender and nondistended. Normal bowel sounds, without guarding, and without rebound.   Neurologic:  Alert and  oriented x4;  grossly normal neurologically.  Impression/Plan: Asante Ritacco is here for an endoscopy to be performed for varices and anemia  Risks, benefits, limitations, and alternatives regarding  endoscopy have been reviewed with the patient.  Questions have been answered.  All parties  agreeable.   Amber Lame, MD  07/24/2017, 7:33 AM

## 2017-07-24 NOTE — Telephone Encounter (Signed)
Tried contacting pt but vm not available to leave a message.

## 2017-07-24 NOTE — Anesthesia Procedure Notes (Signed)
Date/Time: 07/24/2017 9:22 AM Performed by: Cameron Ali Pre-anesthesia Checklist: Patient identified, Emergency Drugs available, Suction available, Timeout performed and Patient being monitored Patient Re-evaluated:Patient Re-evaluated prior to induction Oxygen Delivery Method: Nasal cannula Placement Confirmation: positive ETCO2

## 2017-07-24 NOTE — Transfer of Care (Signed)
Immediate Anesthesia Transfer of Care Note  Patient: Amber Odonnell  Procedure(s) Performed: COLONOSCOPY WITH PROPOFOL (N/A ) ESOPHAGOGASTRODUODENOSCOPY (EGD) WITH PROPOFOL (N/A )  Patient Location: PACU  Anesthesia Type: General  Level of Consciousness: awake, alert  and patient cooperative  Airway and Oxygen Therapy: Patient Spontanous Breathing and Patient connected to supplemental oxygen  Post-op Assessment: Post-op Vital signs reviewed, Patient's Cardiovascular Status Stable, Respiratory Function Stable, Patent Airway and No signs of Nausea or vomiting  Post-op Vital Signs: Reviewed and stable  Complications: No apparent anesthesia complications

## 2017-07-24 NOTE — Telephone Encounter (Signed)
-----   Message from [name], MD sent at 07/23/2017  7:23 PM EDT ----- The patient know that her liver enzymes and blood work was abnormal but not significantly different from her previous labs.

## 2017-07-24 NOTE — Telephone Encounter (Signed)
-----   Message from [name], MD sent at 07/23/2017  9:07 AM EDT ----- Let the patient know the U/S did not show anything except the cirrhosis.

## 2017-07-24 NOTE — Op Note (Signed)
[facility] Gastroenterology Patient Name: Amber Odonnell Procedure Date: 07/24/2017 9:24 AM MRN: [id number] Account #: [id number] Date of Birth: 09-08-1971 Admit Type: Outpatient Age: 46 Room: [facility] OR ROOM 01 Gender: Female Note Status: Finalized Procedure:            Colonoscopy Indications:          Iron deficiency anemia secondary to chronic blood loss Providers:            [name] MD, MD Referring MD:         Halina Maidens, MD (Referring MD) Medicines:            Propofol per Anesthesia Complications:        Hypoxia Procedure:            Pre-Anesthesia Assessment:                       - Prior to the procedure, a History and Physical was                        performed, and patient medications and allergies were                        reviewed. The patient's tolerance of previous                        anesthesia was also reviewed. The risks and benefits of                        the procedure and the sedation options and risks were                        discussed with the patient. All questions were                        answered, and informed consent was obtained. Prior                        Anticoagulants: The patient has taken no previous                        anticoagulant or antiplatelet agents. ASA Grade                        Assessment: II - A patient with mild systemic disease.                        After reviewing the risks and benefits, the patient was                        deemed in satisfactory condition to undergo the                        procedure.                       After obtaining informed consent, the colonoscope was                        passed under direct vision. Throughout the procedure,  the patient's blood pressure, pulse, and oxygen                        saturations were monitored continuously. The [place] (S#: [id number]) was introduced through                     the anus and advanced to the the sigmoid colon. The                        colonoscopy was performed without difficulty. The                        patient tolerated the procedure well. The quality of                        the bowel preparation was excellent. Findings:      The perianal and digital rectal examinations were normal.      The recto-sigmoid colon and sigmoid colon appeared normal.      A diffuse area of moderately friable mucosa with contact bleeding was       found in the sigmoid colon. Impression:           - The recto-sigmoid colon and sigmoid colon are normal.                       - Friability with contact bleeding in the sigmoid colon.                       - No specimens collected.                       - Procedure terminated due to decreased breathing and                        friable mucosa. Recommendation:       - Discharge patient to home.                       - Clear liquid diet for 2 days. Procedure Code(s):    --- Professional ---                       [phone], 46, Colonoscopy, flexible; diagnostic, including                        collection of specimen(s) by brushing or washing, when                        performed (separate procedure) Diagnosis Code(s):    --- Professional ---                       D50.0, Iron deficiency anemia secondary to blood loss                        (chronic) CPT copyright 2016 American Medical Association. All rights reserved. The codes documented in this report are preliminary and upon coder review may  be revised to meet current compliance requirements. [name] MD,  MD 07/24/2017 9:55:19 AM This report has been signed electronically. Number of Addenda: 0 Note Initiated On: 07/24/2017 9:24 AM Total Procedure Duration: 0 hours 4 minutes 49 seconds       [facility]

## 2017-07-24 NOTE — Telephone Encounter (Deleted)
-----   Message from [name], MD sent at 07/23/2017  7:23 PM EDT ----- The patient know that her liver enzymes and blood work was abnormal but not significantly different from her previous labs.

## 2017-07-24 NOTE — Anesthesia Preprocedure Evaluation (Signed)
Anesthesia Evaluation  Patient identified by MRN, date of birth, ID band Patient awake    Reviewed: Allergy & Precautions, H&P , NPO status , Patient's Chart, lab work & pertinent test results  Airway Mallampati: II  TM Distance: >3 FB Neck ROM: full    Dental no notable dental hx.    Pulmonary asthma , sleep apnea ,    Pulmonary exam normal breath sounds clear to auscultation       Cardiovascular hypertension, Normal cardiovascular exam Rhythm:regular Rate:Normal     Neuro/Psych PSYCHIATRIC DISORDERS    GI/Hepatic GERD  ,(+) Cirrhosis       , Hepatitis -  Endo/Other  diabetesHypothyroidism Morbid obesity  Renal/GU Renal disease     Musculoskeletal   Abdominal   Peds  Hematology   Anesthesia Other Findings   Reproductive/Obstetrics                             Anesthesia Physical  Anesthesia Plan  ASA: IV  Anesthesia Plan: General   Post-op Pain Management:    Induction:   PONV Risk Score and Plan: 3 and Propofol infusion  Airway Management Planned:   Additional Equipment:   Intra-op Plan:   Post-operative Plan:   Informed Consent: I have reviewed the patients History and Physical, chart, labs and discussed the procedure including the risks, benefits and alternatives for the proposed anesthesia with the patient or authorized representative who has indicated his/her understanding and acceptance.     Plan Discussed with: CRNA  Anesthesia Plan Comments:         Anesthesia Quick Evaluation

## 2017-07-24 NOTE — Op Note (Addendum)
[facility] Gastroenterology Patient Name: Amber Odonnell Procedure Date: 07/24/2017 9:23 AM MRN: [id number] Account #: [id number] Date of Birth: 06-12-1971 Admit Type: Outpatient Age: 46 Room: [facility] OR ROOM 01 Gender: Female Note Status: Finalized Procedure:            Upper GI endoscopy Indications:          Iron deficiency anemia secondary to chronic blood loss Providers:            [name] MD, MD Referring MD:         Halina Maidens, MD (Referring MD) Medicines:            Propofol per Anesthesia Complications:        No immediate complications. Procedure:            Pre-Anesthesia Assessment:                       - Prior to the procedure, a History and Physical was                        performed, and patient medications and allergies were                        reviewed. The patient's tolerance of previous                        anesthesia was also reviewed. The risks and benefits of                        the procedure and the sedation options and risks were                        discussed with the patient. All questions were                        answered, and informed consent was obtained. Prior                        Anticoagulants: The patient has taken no previous                        anticoagulant or antiplatelet agents. ASA Grade                        Assessment: II - A patient with mild systemic disease.                        After reviewing the risks and benefits, the patient was                        deemed in satisfactory condition to undergo the                        procedure.                       After obtaining informed consent, the endoscope was                        passed under direct vision. Throughout the procedure,  the patient's blood pressure, pulse, and oxygen                        saturations were monitored continuously. The Olympus                        [id number]-HQ190 Endoscope (S#. [id number]) was  introduced                        through the mouth, and advanced to the second part of                        duodenum. The upper GI endoscopy was accomplished                        without difficulty. The patient tolerated the procedure                        well. Findings:      Grade III varices were found in the lower third of the esophagus. Five       bands were successfully placed with incomplete eradication of varices.       There was no bleeding at the end of the procedure.      The stomach was normal.      The examined duodenum was normal. Impression:           - Grade III esophageal varices. Incompletely                        eradicated. Banded.                       - Normal stomach.                       - Normal examined duodenum.                       - No specimens collected. Recommendation:       - Discharge patient to home.                       - Clear liquid diet for 2 days.                       - If any black stools, vomiting blood, weakness or                        dizzyness then go to ER.                       - Repeat upper endoscopy in 4 weeks for retreatment. Procedure Code(s):    --- Professional ---                       [phone], Esophagogastroduodenoscopy, flexible, transoral;                        with band ligation of esophageal/gastric varices Diagnosis Code(s):    --- Professional ---                       D50.0, Iron deficiency anemia secondary  to blood loss                        (chronic)                       I85.00, Esophageal varices without bleeding CPT copyright 2016 American Medical Association. All rights reserved. The codes documented in this report are preliminary and upon coder review may  be revised to meet current compliance requirements. [name] MD, MD 07/24/2017 9:42:59 AM This report has been signed electronically. Number of Addenda: 0 Note Initiated On: 07/24/2017 9:23 AM      [facility]

## 2017-07-24 NOTE — Anesthesia Postprocedure Evaluation (Signed)
Anesthesia Post Note  Patient: Amber Odonnell  Procedure(s) Performed: COLONOSCOPY WITH PROPOFOL (N/A ) ESOPHAGOGASTRODUODENOSCOPY (EGD) WITH PROPOFOL (N/A )  Patient location during evaluation: PACU Anesthesia Type: General Level of consciousness: awake and alert and oriented Pain management: satisfactory to patient Vital Signs Assessment: post-procedure vital signs reviewed and stable Respiratory status: spontaneous breathing, nonlabored ventilation and respiratory function stable Cardiovascular status: blood pressure returned to baseline and stable Postop Assessment: Adequate PO intake and No signs of nausea or vomiting Anesthetic complications: no    Raliegh Ip

## 2017-07-27 ENCOUNTER — Encounter: Payer: Self-pay | Admitting: Gastroenterology

## 2017-07-28 ENCOUNTER — Encounter: Payer: Self-pay | Admitting: Gastroenterology

## 2017-07-30 ENCOUNTER — Ambulatory Visit (INDEPENDENT_AMBULATORY_CARE_PROVIDER_SITE_OTHER): Payer: BLUE CROSS/BLUE SHIELD | Admitting: Internal Medicine

## 2017-07-30 ENCOUNTER — Encounter: Payer: Self-pay | Admitting: Internal Medicine

## 2017-07-30 VITALS — BP 124/84 | HR 108 | Temp 98.8°F | Resp 16 | Ht 63.0 in | Wt 169.8 lb

## 2017-07-30 DIAGNOSIS — M791 Myalgia, unspecified site: Secondary | ICD-10-CM | POA: Diagnosis not present

## 2017-07-30 DIAGNOSIS — E876 Hypokalemia: Secondary | ICD-10-CM

## 2017-07-30 DIAGNOSIS — I8500 Esophageal varices without bleeding: Secondary | ICD-10-CM

## 2017-07-30 DIAGNOSIS — E119 Type 2 diabetes mellitus without complications: Secondary | ICD-10-CM

## 2017-07-30 DIAGNOSIS — Z794 Long term (current) use of insulin: Secondary | ICD-10-CM

## 2017-07-30 DIAGNOSIS — B370 Candidal stomatitis: Secondary | ICD-10-CM | POA: Diagnosis not present

## 2017-07-30 MED ORDER — MAGIC MOUTHWASH: 5.0000 mL | Freq: Four times a day (QID) | ORAL | 0 refills | Status: DC

## 2017-07-30 NOTE — Patient Instructions (Addendum)
Make sure that you are not spironolactone (aldactone)  Just be furosemide 40 mg once a day  Continue Tresiba - but maybe take only half the amount and check BS regularly Call to get a follow up with Dr. Gabriel Carina in December or January  Call to get follow up appt with Dr Allen Norris

## 2017-07-30 NOTE — Progress Notes (Signed)
Date:  07/30/2017   Name:  Amber Odonnell   DOB:  August 04, 1971   MRN:  [id number]   Chief Complaint: Back Pain (Started 2 weeks ago and has had Upper/ Lower GI done Friday but seen in ER prior to for vomiting and extreme dehydration with BP 54/41 at the time. ) and Dehydration (Feeling like she has more dehydration even after water and gatorade all day. Lips are dry and eyes and skin. )  Back Pain  This is a new problem. The current episode started 1 to 4 weeks ago. The problem is unchanged. The pain is present in the lumbar spine. Pertinent negatives include no chest pain, dysuria, fever, headaches or weakness.  She continues to have muscle aches and general fatigue.  She still having trouble swallowing due to some nausea and discomfort.  She has not had any muscle spasms numbness or tingling in her legs or feet.  Most of her back discomfort seems to be in the lumbar area but also in the paraspinous muscles in general.  No neck stiffness.  No fever or chills.  She recently underwent EGD with banding of 5 varices.  She had labs 7 days ago which showed a low potassium and a low calcium.  No supplementation was recommended.  She has spironolactone and furosemide on her med list.  She is not certain if she is taking both of these but does not believe that she is taking spironolactone.  Her mouth is dry her skin is dry and she feels fatigued.  She has missed a number of days of work in the past 2 weeks.  She complains of soreness of her tongue and decreased taste sensation.  No recent antibiotics.  She has a white coating noted on her tongue.  She has been brushing her teeth with baking soda because of discomfort.  She also states that her blood sugars have been running extremely low.  Her current regimen is to receive a 92 units a day with NovoLog 60 units before meals.  Her sugars have been in the 90-110 range.  As a result she is been afraid to take any of her insulin. Review of Systems    Constitutional: Positive for fatigue. Negative for chills and fever.  Eyes: Negative for visual disturbance.  Respiratory: Negative for chest tightness, shortness of breath and wheezing.   Cardiovascular: Negative for chest pain and palpitations.  Gastrointestinal: Positive for abdominal distention and nausea.  Genitourinary: Negative for dysuria.  Musculoskeletal: Positive for back pain and myalgias. Negative for neck pain and neck stiffness.  Skin: Negative for rash.       Very dry skin  Neurological: Negative for dizziness, tremors, weakness, light-headedness and headaches.    Patient Active Problem List   Diagnosis Date Noted  . Iron deficiency anemia secondary to blood loss (chronic)   . Secondary esophageal varices without bleeding ([place])   . Renal insufficiency 07/14/2017  . Anemia 07/14/2017  . Enlarged pituitary gland ([place]) 07/10/2017  . Headache syndrome 07/10/2017  . Secondary esophageal varices with bleeding ([place])   . Vaginal flatus 03/13/2017  . Eye discharge 03/13/2017  . Esophageal varices determined by endoscopy ([place]) 03/11/2017  . Abnormal feces   . Polyp of sigmoid colon   . Gastritis without bleeding   . Environmental and seasonal allergies 01/21/2017  . Type 2 diabetes mellitus without complication, with long-term current use of insulin ([place]) 01/21/2017  . Hypothyroidism due to acquired atrophy of thyroid 01/21/2017  . Hyperlipidemia  associated with type 2 diabetes mellitus ([place]) 01/21/2017  . Irritable bowel syndrome with constipation 01/21/2017  . Obstructive sleep apnea syndrome 01/21/2017  . Thrombocytopenia ([place])   . Splenomegaly   . Portal hypertension ([place])   . Obesity (BMI 30-39.9)   . Essential hypertension   . Hyperlipidemia   . Generalized anxiety disorder   . Hepatic cirrhosis ([place])   . Chronic gastritis   . Arthritis     Prior to Admission medications   Medication Sig Start Date End Date Taking? Authorizing Provider  B Complex Vitamins  (B-COMPLEX/B-12 SL) Place under the tongue every morning.   Yes [provider]  BD PEN NEEDLE NANO U/F 32G X 4 MM MISC Inject 1 each into the skin 4 (four) times daily. 01/09/17  Yes [provider]  Continuous Blood Gluc Receiver (FREESTYLE LIBRE READER) DEVI Use 1 Device once daily. 06/15/17  Yes [provider]  Continuous Blood Gluc Sensor (FREESTYLE LIBRE SENSOR SYSTEM) MISC Use 1 kit every 10 (ten) days. 06/15/17  Yes [provider]  diclofenac (VOLTAREN) 75 MG EC tablet Take by mouth.   Yes [provider]  empagliflozin (JARDIANCE) 10 MG TABS tablet Take 10 mg by mouth daily.   Yes [provider]  fluticasone (FLONASE) 50 MCG/ACT nasal spray Place 2 sprays into both nostrils daily.   Yes [provider]  furosemide (LASIX) 40 MG tablet Take 1 tablet (40 mg total) by mouth daily. 04/10/17 04/10/18 Yes Wohl, Darren, MD  insulin aspart (NOVOLOG FLEXPEN) 100 UNIT/ML FlexPen Inject 60 Units into the skin 3 (three) times daily with meals. 06/09/17  Yes Glean Hess, MD  insulin aspart protamine- aspart (NOVOLOG MIX 70/30) (70-30) 100 UNIT/ML injection Inject 60 Units into the skin 3 (three) times daily.   Yes [provider]  Insulin Degludec (TRESIBA FLEXTOUCH [place]) Inject 92 Units into the skin daily.    Yes [provider]  lactulose (CEPHULAC) 20 g packet Take 20 g by mouth daily.   Yes [provider]  levothyroxine (SYNTHROID, LEVOTHROID) 50 MCG tablet Take 1 tablet (50 mcg total) by mouth daily before breakfast. 04/20/17  Yes Glean Hess, MD  losartan (COZAAR) 50 MG tablet Take 1 tablet (50 mg total) by mouth daily. May take 1/2 twice a day 07/14/17  Yes Glean Hess, MD  MEGARED OMEGA-3 KRILL OIL PO Take by mouth.   Yes [provider]  Multiple Vitamins-Minerals (ALIVE ONCE DAILY WOMENS 50+ PO) Take by mouth.   Yes [provider]  pantoprazole (PROTONIX) 40 MG tablet Take 1  tablet (40 mg total) by mouth daily. 04/16/17  Yes [name], MD  rosuvastatin (CRESTOR) 10 MG tablet Take 10 mg by mouth daily.   Yes [provider]  sertraline (ZOLOFT) 50 MG tablet TAKE 1 AND 1/2 TABLETS(75 MG) BY MOUTH DAILY Patient taking differently: 1.5 - 2 tabs 06/05/17  Yes Glean Hess, MD  spironolactone (ALDACTONE) 100 MG tablet Take 100 mg by mouth. 06/25/17   [provider]    Allergies  Allergen Reactions  . Hydrocodone Other (See Comments)    thrush    Past Surgical History:  Procedure Laterality Date  . COLONOSCOPY WITH PROPOFOL N/A 07/24/2017   Procedure: COLONOSCOPY WITH PROPOFOL;  Surgeon: [name], MD;  Location: [place];  Service: Endoscopy;  Laterality: N/A;  . ESOPHAGOGASTRODUODENOSCOPY (EGD) WITH PROPOFOL N/A 02/26/2017   Procedure: ESOPHAGOGASTRODUODENOSCOPY (EGD) WITH Banding;  Surgeon: [name], MD;  Location:  [place];  Service: Endoscopy;  Laterality: N/A;  . ESOPHAGOGASTRODUODENOSCOPY (EGD) WITH PROPOFOL N/A 04/10/2017   Procedure: ESOPHAGOGASTRODUODENOSCOPY (EGD) WITH PROPOFOL;  Surgeon: [name], MD;  Location: [place];  Service: Endoscopy;  Laterality: N/A;  Diabetic  sleep apnea  with banding  . ESOPHAGOGASTRODUODENOSCOPY (EGD) WITH PROPOFOL N/A 07/24/2017   Procedure: ESOPHAGOGASTRODUODENOSCOPY (EGD) WITH PROPOFOL;  Surgeon: [name], MD;  Location: [place];  Service: Endoscopy;  Laterality: N/A;  Diabetic - insulin  sleep apnea  . TONSILLECTOMY    . TOTAL VAGINAL HYSTERECTOMY     endometriosis    Social History  Substance Use Topics  . Smoking status: Never Smoker  . Smokeless tobacco: Never Used  . Alcohol use No     Medication list has been reviewed and updated.  PHQ 2/9 Scores 07/10/2017  PHQ - 2 Score 0    Physical Exam  Constitutional: She is oriented to person, place, and time. She appears well-developed. No distress.  HENT:  Head:  Normocephalic and atraumatic.  Mouth/Throat: Mucous membranes are dry. Posterior oropharyngeal erythema present. No posterior oropharyngeal edema.  Neck: Normal range of motion. Neck supple.  Cardiovascular: Normal rate, regular rhythm and normal heart sounds.   Pulmonary/Chest: Effort normal and breath sounds normal. No respiratory distress. She has no wheezes.  Musculoskeletal: She exhibits no edema.  General tenderness along spine on both sides; no discrete spasm. SLR is negative  Neurological: She is alert and oriented to person, place, and time. She has normal strength and normal reflexes. No sensory deficit.  Skin: Skin is warm and dry. No rash noted.  Psychiatric: Her behavior is normal. Thought content normal. Her mood appears anxious.  Nursing note and vitals reviewed.   BP 124/84   Pulse (!) 108   Temp 98.8 F (37.1 C)   Resp 16   Ht _0  (1.6 m)   Wt 169 lb 12.8 oz (77 kg)   SpO2 95%   BMI 30.08 kg/m   Assessment and Plan: 1. Myalgia Check labs to rule electrolyte disturbance - Magnesium - Calcium  2. Hypokalemia - Renal function panel  3. Thrush - magic mouthwash SOLN; Take 5 mLs by mouth 4 (four) times daily.  Dispense: 200 mL; Refill: 0  4. Esophageal varices determined by endoscopy ([place]) Pt to check meds at home - not to take spironolactone. Schedule follow up with Dr. Allen Norris  5. Type 2 diabetes mellitus without complication, with long-term current use of insulin ([facility]) Take 1/2 of tresiba dose Monitor BS closely Call for follow up with Endo in the near future  Meds ordered this encounter  Medications  . magic mouthwash SOLN    Sig: Take 5 mLs by mouth 4 (four) times daily.    Dispense:  200 mL    Refill:  0    Partially dictated using Editor, commissioning. Any errors are unintentional.  Halina Maidens, MD [facility]  07/30/2017

## 2017-07-31 ENCOUNTER — Encounter: Payer: Self-pay | Admitting: Internal Medicine

## 2017-07-31 ENCOUNTER — Other Ambulatory Visit: Payer: Self-pay | Admitting: Internal Medicine

## 2017-07-31 ENCOUNTER — Telehealth: Payer: Self-pay

## 2017-07-31 DIAGNOSIS — B370 Candidal stomatitis: Secondary | ICD-10-CM

## 2017-07-31 LAB — RENAL FUNCTION PANEL
Albumin: 3.3 g/dL — ABNORMAL LOW (ref 3.5–5.5)
BUN / CREAT RATIO: 5 — AB (ref 9–23)
BUN: 3 mg/dL — AB (ref 6–24)
CO2: 21 mmol/L (ref 20–29)
CREATININE: 0.59 mg/dL (ref 0.57–1.00)
Calcium: 8.3 mg/dL — ABNORMAL LOW (ref 8.7–10.2)
Chloride: 103 mmol/L (ref 96–106)
GFR calc non Af Amer: 109 mL/min/{1.73_m2} (ref >59)
GFR, EST AFRICAN AMERICAN: 125 mL/min/{1.73_m2} (ref >59)
Glucose: 116 mg/dL — ABNORMAL HIGH (ref 65–99)
Phosphorus: 2.8 mg/dL (ref 2.5–4.5)
Potassium: 3.6 mmol/L (ref 3.5–5.2)
Sodium: 141 mmol/L (ref 134–144)

## 2017-07-31 LAB — MAGNESIUM: Magnesium: 2.3 mg/dL (ref 1.6–2.3)

## 2017-07-31 MED ORDER — MAGIC MOUTHWASH: 5.0000 mL | Freq: Four times a day (QID) | ORAL | 0 refills | Status: DC

## 2017-07-31 NOTE — Telephone Encounter (Signed)
-----   Message from [name], MD sent at 07/23/2017  7:23 PM EDT ----- The patient know that her liver enzymes and blood work was abnormal but not significantly different from her previous labs.

## 2017-07-31 NOTE — Telephone Encounter (Signed)
Please Advise. Mychart message

## 2017-07-31 NOTE — Telephone Encounter (Signed)
Pt notified of lab results.   Pt would like to know about the bleeding in her colon when you did the colonoscopy. We will be rescheduling her EGD for mid Nov once I call back with this other information.   She also wanted to know about the liver transplant list. She was seen in Sept and was told she was not a candidate as its too early to consider for a liver transplant evaluation at this time. She stated you told her she should get another referral back for a new evaluation.

## 2017-08-03 NOTE — Telephone Encounter (Signed)
Spoke with pt and advised her to contact [facility] liver clinic to schedule a follow up appt.   She stated she was still having bleeding from her rectum. She is having bright red blood mixed with stool. No clots noted. Please advise.

## 2017-08-03 NOTE — Telephone Encounter (Signed)
We had already talked about her visit to the liver transplant department and I was aware that she was told she was not a candidate.  Since she has decompensated since then she should call them and have a follow-up appointment she does not need a new referral.  The colonoscopy showed very friable and easily bleeding colon tissue and let me know if the bleeding has not stopped.

## 2017-08-05 ENCOUNTER — Encounter: Payer: Self-pay | Admitting: Dietician

## 2017-08-05 ENCOUNTER — Encounter: Payer: BLUE CROSS/BLUE SHIELD | Attending: Internal Medicine | Admitting: Dietician

## 2017-08-05 VITALS — Ht 63.0 in | Wt 164.5 lb

## 2017-08-05 DIAGNOSIS — K7460 Unspecified cirrhosis of liver: Secondary | ICD-10-CM

## 2017-08-05 DIAGNOSIS — Z794 Long term (current) use of insulin: Secondary | ICD-10-CM | POA: Diagnosis not present

## 2017-08-05 DIAGNOSIS — E119 Type 2 diabetes mellitus without complications: Secondary | ICD-10-CM

## 2017-08-05 NOTE — Patient Instructions (Signed)
   Make sure to have something to eat every 3-4 hours during the day.   Include a source of protein along with a small amount of carbohydrate (a piece of bread, 1/2 cup of fruit, up to 1 cup of cereal, 2/3 cup rice, etc.)  Cool foods can be more tolerable when you don't feel well, such as cheese, fruit, sugar free pudding, yogurt, boiled eggs/ egg whites.   Consider a protein drink, such as Premier protein to help meet protein and nutrition needs for the day. Carnation Breakfast drink (low sugar) is another option.  Daily goal for protein is about 60grams.

## 2017-08-05 NOTE — Telephone Encounter (Signed)
Let her know that because of her liver disease her clotting time is prolonged to it may take longer for her to stop bleeding. The very friable colon was the reason we stopped the colonoscopy when we saw her colon bleed.

## 2017-08-05 NOTE — Progress Notes (Signed)
Medical Nutrition Therapy: Visit start time: 1630  end time: 1730  Assessment:  Diagnosis: cirrhosis Past medical history: diabetes, hypothyroid Psychosocial issues/ stress concerns: none Preferred learning method:  . Auditory . Hands-on  Current weight: 164.5lbs  Height: 5'3" Medications, supplements: reconciled list in medical record  Progress and evaluation: Patient reports GI symptoms of nausea for the past 4 weeks; she is eating soft foods, small portions to avoid nausea and any vomiting. She had some vomiting after surgery to treat esophageal varices but not recently. She has started on diuretic medication and weight has decreased about 15lbs. She reports her BGs have dramatically improved, now averaging in 120s.    Physical activity: none  Dietary Intake:  Usual eating pattern includes 3 meals and 1-2 snacks per day. Dining out frequency: 0 meals per week.  Breakfast: oatmeal, corn flakes (soggy) in milk; greek yogurt with berries Snack: remainder of breakfast Lunch: peanut butter, soft fruit from salad bar Snack: gatorade Supper: cereal, mac and cheese Snack: none Beverages: water, Gatorade; likes orange juice,ginger ale but tries to limit due to sugar-  Nutrition Care Education: Topics covered: diabetes, cirrhosis     Diabetes: appropriate meal and snack schedule, sick day guidelines including soft, easily digestible foods, eating frequently throughout the day, avoiding overly hot, spicy, high fat foods, controlled portions of starches and fruits; also advised checking BGs more often, keeping ginger ale and juice on hand in case of low BG or to avoid hypoglycemia, keeping MD aware of symptoms.  Cirrhosis: low sodium diet guidelines, goals for sodium intake and importance of reading food labels. Sick day guidelines as noted above. Other: Advised including a protein source as often as able during the day, discussed options for protein drinks or nutritional drinks should GI  symptoms and weight loss continue.  Nutritional Diagnosis:  [place]-2.2 Altered nutrition-related laboratory As related to diabetes.  As evidenced by HbA1C of 10.8%. [place]-1.4 Altered GI function As related to symptoms of nausea, esophageal varices.  As evidenced by limited food intake per patient report, recent weight loss. [place]-3.3 Overweight/obesity As related to history of excess calories.  As evidenced by BMI 29, patient report.  Intervention: Instruction as noted above.   Set goals with input from patient.   She will be following up with gastroenterologist regarding ongoing nausea.    Offered instruction on diabetic meal planning, but patient would like to wait until she feels better.   She will schedule RD follow up once nausea resolves.  Education Materials given:  . Sick Day Guidelines . Following a Low Sodium Diet . Goals/ instructions   Learner/ who was taught:  . Patient   Level of understanding: Marland Kitchen Verbalizes/ demonstrates competency  Demonstrated degree of understanding via:   Teach back Learning barriers: . None  Willingness to learn/ readiness for change: . Eager, change in progress  Monitoring and Evaluation:  Dietary intake, exercise, BG control, GI symptoms, and body weight      follow up: prn

## 2017-08-06 ENCOUNTER — Telehealth: Payer: Self-pay | Admitting: Gastroenterology

## 2017-08-06 NOTE — Telephone Encounter (Signed)
Patient is waiting for you to speak to Dr. Allen Norris regarding EGD and colonoscopy. Please call

## 2017-08-10 DIAGNOSIS — I959 Hypotension, unspecified: Secondary | ICD-10-CM | POA: Diagnosis not present

## 2017-08-10 DIAGNOSIS — I10 Essential (primary) hypertension: Secondary | ICD-10-CM | POA: Diagnosis not present

## 2017-08-10 DIAGNOSIS — M25512 Pain in left shoulder: Secondary | ICD-10-CM | POA: Diagnosis not present

## 2017-08-10 DIAGNOSIS — K921 Melena: Secondary | ICD-10-CM | POA: Diagnosis not present

## 2017-08-10 DIAGNOSIS — Z6828 Body mass index (BMI) 28.0-28.9, adult: Secondary | ICD-10-CM | POA: Diagnosis not present

## 2017-08-10 DIAGNOSIS — M25551 Pain in right hip: Secondary | ICD-10-CM | POA: Diagnosis not present

## 2017-08-10 DIAGNOSIS — K7460 Unspecified cirrhosis of liver: Secondary | ICD-10-CM | POA: Diagnosis not present

## 2017-08-10 DIAGNOSIS — Z79899 Other long term (current) drug therapy: Secondary | ICD-10-CM | POA: Diagnosis not present

## 2017-08-10 DIAGNOSIS — E785 Hyperlipidemia, unspecified: Secondary | ICD-10-CM | POA: Diagnosis not present

## 2017-08-10 DIAGNOSIS — E039 Hypothyroidism, unspecified: Secondary | ICD-10-CM | POA: Diagnosis not present

## 2017-08-10 DIAGNOSIS — E441 Mild protein-calorie malnutrition: Secondary | ICD-10-CM | POA: Diagnosis not present

## 2017-08-10 DIAGNOSIS — M25511 Pain in right shoulder: Secondary | ICD-10-CM | POA: Diagnosis not present

## 2017-08-10 DIAGNOSIS — F411 Generalized anxiety disorder: Secondary | ICD-10-CM | POA: Diagnosis not present

## 2017-08-10 DIAGNOSIS — K7581 Nonalcoholic steatohepatitis (NASH): Secondary | ICD-10-CM | POA: Diagnosis not present

## 2017-08-10 DIAGNOSIS — M25552 Pain in left hip: Secondary | ICD-10-CM | POA: Diagnosis not present

## 2017-08-10 DIAGNOSIS — E669 Obesity, unspecified: Secondary | ICD-10-CM | POA: Diagnosis not present

## 2017-08-10 DIAGNOSIS — M179 Osteoarthritis of knee, unspecified: Secondary | ICD-10-CM | POA: Diagnosis not present

## 2017-08-10 DIAGNOSIS — E119 Type 2 diabetes mellitus without complications: Secondary | ICD-10-CM | POA: Diagnosis not present

## 2017-08-10 NOTE — Telephone Encounter (Signed)
Tried contacting pt but vm box is not set up.

## 2017-08-11 NOTE — Telephone Encounter (Signed)
Contacted pt again today and she stated she is going to reschedule her appt with the liver specialist at [facility]. They will continue her care there.

## 2017-08-20 DIAGNOSIS — K7460 Unspecified cirrhosis of liver: Secondary | ICD-10-CM | POA: Diagnosis not present

## 2017-08-24 ENCOUNTER — Ambulatory Visit: Payer: BLUE CROSS/BLUE SHIELD | Admitting: Gastroenterology

## 2017-09-18 ENCOUNTER — Telehealth: Payer: Self-pay

## 2017-09-18 DIAGNOSIS — Z794 Long term (current) use of insulin: Secondary | ICD-10-CM | POA: Diagnosis not present

## 2017-09-18 DIAGNOSIS — E1165 Type 2 diabetes mellitus with hyperglycemia: Secondary | ICD-10-CM | POA: Diagnosis not present

## 2017-09-18 LAB — HEMOGLOBIN A1C: Hemoglobin A1C: 7.5

## 2017-09-18 NOTE — Telephone Encounter (Signed)
Levada Dy from [place] just left VM to inform us patient signed up to have a case Freight forwarder with insurance. Her number is [phone] if needed to reach her.

## 2017-09-22 DIAGNOSIS — K7460 Unspecified cirrhosis of liver: Secondary | ICD-10-CM | POA: Diagnosis not present

## 2017-09-22 DIAGNOSIS — E1165 Type 2 diabetes mellitus with hyperglycemia: Secondary | ICD-10-CM | POA: Diagnosis not present

## 2017-09-22 DIAGNOSIS — K760 Fatty (change of) liver, not elsewhere classified: Secondary | ICD-10-CM | POA: Diagnosis not present

## 2017-09-22 DIAGNOSIS — E039 Hypothyroidism, unspecified: Secondary | ICD-10-CM | POA: Diagnosis not present

## 2017-10-08 DIAGNOSIS — G4730 Sleep apnea, unspecified: Secondary | ICD-10-CM | POA: Diagnosis not present

## 2017-10-08 DIAGNOSIS — E669 Obesity, unspecified: Secondary | ICD-10-CM | POA: Diagnosis not present

## 2017-10-08 DIAGNOSIS — E119 Type 2 diabetes mellitus without complications: Secondary | ICD-10-CM | POA: Diagnosis not present

## 2017-10-08 DIAGNOSIS — I10 Essential (primary) hypertension: Secondary | ICD-10-CM | POA: Diagnosis not present

## 2017-10-08 DIAGNOSIS — Z538 Procedure and treatment not carried out for other reasons: Secondary | ICD-10-CM | POA: Diagnosis not present

## 2017-10-08 DIAGNOSIS — K31819 Angiodysplasia of stomach and duodenum without bleeding: Secondary | ICD-10-CM | POA: Diagnosis not present

## 2017-10-08 DIAGNOSIS — I8510 Secondary esophageal varices without bleeding: Secondary | ICD-10-CM | POA: Diagnosis not present

## 2017-10-08 DIAGNOSIS — K625 Hemorrhage of anus and rectum: Secondary | ICD-10-CM | POA: Diagnosis not present

## 2017-10-08 DIAGNOSIS — K644 Residual hemorrhoidal skin tags: Secondary | ICD-10-CM | POA: Diagnosis not present

## 2017-10-08 DIAGNOSIS — E785 Hyperlipidemia, unspecified: Secondary | ICD-10-CM | POA: Diagnosis not present

## 2017-10-08 DIAGNOSIS — K641 Second degree hemorrhoids: Secondary | ICD-10-CM | POA: Diagnosis not present

## 2017-10-08 DIAGNOSIS — K7581 Nonalcoholic steatohepatitis (NASH): Secondary | ICD-10-CM | POA: Diagnosis not present

## 2017-10-08 DIAGNOSIS — E039 Hypothyroidism, unspecified: Secondary | ICD-10-CM | POA: Diagnosis not present

## 2017-10-08 DIAGNOSIS — K581 Irritable bowel syndrome with constipation: Secondary | ICD-10-CM | POA: Diagnosis not present

## 2017-10-08 DIAGNOSIS — M1710 Unilateral primary osteoarthritis, unspecified knee: Secondary | ICD-10-CM | POA: Diagnosis not present

## 2017-10-08 DIAGNOSIS — I8500 Esophageal varices without bleeding: Secondary | ICD-10-CM | POA: Diagnosis not present

## 2017-10-08 DIAGNOSIS — F411 Generalized anxiety disorder: Secondary | ICD-10-CM | POA: Diagnosis not present

## 2017-10-08 HISTORY — PX: COLONOSCOPY: SHX5424

## 2017-10-08 LAB — HM COLONOSCOPY

## 2017-10-09 ENCOUNTER — Encounter: Payer: Self-pay | Admitting: Internal Medicine

## 2017-10-12 ENCOUNTER — Other Ambulatory Visit: Payer: Self-pay | Admitting: Internal Medicine

## 2017-10-12 ENCOUNTER — Telehealth: Payer: Self-pay | Admitting: Gastroenterology

## 2017-10-12 ENCOUNTER — Other Ambulatory Visit: Payer: Self-pay

## 2017-10-12 DIAGNOSIS — G4733 Obstructive sleep apnea (adult) (pediatric): Secondary | ICD-10-CM | POA: Diagnosis not present

## 2017-10-12 NOTE — Telephone Encounter (Signed)
Patient called in & stated the prescription called into [facility] on [address] in [place] for her Hepatitis a was unable to be filled in the shot form because the shot is for people 49 yrs old & younger.She would like a new script sent.

## 2017-10-13 ENCOUNTER — Other Ambulatory Visit: Payer: Self-pay

## 2017-10-13 NOTE — Telephone Encounter (Signed)
Havrix (Hepatitis A) vaccine called into [facility], [address] per pt request.

## 2017-11-07 ENCOUNTER — Other Ambulatory Visit: Payer: Self-pay | Admitting: Internal Medicine

## 2017-11-10 DIAGNOSIS — I10 Essential (primary) hypertension: Secondary | ICD-10-CM | POA: Diagnosis not present

## 2017-11-10 DIAGNOSIS — F411 Generalized anxiety disorder: Secondary | ICD-10-CM | POA: Diagnosis not present

## 2017-11-10 DIAGNOSIS — K766 Portal hypertension: Secondary | ICD-10-CM | POA: Diagnosis not present

## 2017-11-10 DIAGNOSIS — I8511 Secondary esophageal varices with bleeding: Secondary | ICD-10-CM | POA: Diagnosis not present

## 2017-11-10 DIAGNOSIS — G9340 Encephalopathy, unspecified: Secondary | ICD-10-CM | POA: Diagnosis not present

## 2017-11-10 DIAGNOSIS — Z794 Long term (current) use of insulin: Secondary | ICD-10-CM | POA: Diagnosis not present

## 2017-11-10 DIAGNOSIS — E785 Hyperlipidemia, unspecified: Secondary | ICD-10-CM | POA: Diagnosis not present

## 2017-11-10 DIAGNOSIS — I361 Nonrheumatic tricuspid (valve) insufficiency: Secondary | ICD-10-CM | POA: Diagnosis not present

## 2017-11-10 DIAGNOSIS — E109 Type 1 diabetes mellitus without complications: Secondary | ICD-10-CM | POA: Diagnosis not present

## 2017-11-10 DIAGNOSIS — E669 Obesity, unspecified: Secondary | ICD-10-CM | POA: Diagnosis not present

## 2017-11-10 DIAGNOSIS — K7290 Hepatic failure, unspecified without coma: Secondary | ICD-10-CM | POA: Diagnosis not present

## 2017-11-10 DIAGNOSIS — Z9889 Other specified postprocedural states: Secondary | ICD-10-CM | POA: Diagnosis not present

## 2017-11-10 DIAGNOSIS — K7581 Nonalcoholic steatohepatitis (NASH): Secondary | ICD-10-CM | POA: Diagnosis not present

## 2017-11-10 DIAGNOSIS — R188 Other ascites: Secondary | ICD-10-CM | POA: Diagnosis not present

## 2017-11-10 DIAGNOSIS — Z885 Allergy status to narcotic agent status: Secondary | ICD-10-CM | POA: Diagnosis not present

## 2017-11-10 DIAGNOSIS — D689 Coagulation defect, unspecified: Secondary | ICD-10-CM | POA: Diagnosis not present

## 2017-11-10 DIAGNOSIS — E119 Type 2 diabetes mellitus without complications: Secondary | ICD-10-CM | POA: Diagnosis not present

## 2017-11-10 DIAGNOSIS — D696 Thrombocytopenia, unspecified: Secondary | ICD-10-CM | POA: Diagnosis not present

## 2017-11-10 DIAGNOSIS — I864 Gastric varices: Secondary | ICD-10-CM | POA: Diagnosis not present

## 2017-11-10 DIAGNOSIS — K31819 Angiodysplasia of stomach and duodenum without bleeding: Secondary | ICD-10-CM | POA: Diagnosis not present

## 2017-11-10 DIAGNOSIS — E039 Hypothyroidism, unspecified: Secondary | ICD-10-CM | POA: Diagnosis not present

## 2017-11-10 DIAGNOSIS — R932 Abnormal findings on diagnostic imaging of liver and biliary tract: Secondary | ICD-10-CM | POA: Diagnosis not present

## 2017-11-10 DIAGNOSIS — K7469 Other cirrhosis of liver: Secondary | ICD-10-CM | POA: Diagnosis not present

## 2017-11-10 DIAGNOSIS — R509 Fever, unspecified: Secondary | ICD-10-CM | POA: Diagnosis not present

## 2017-11-10 DIAGNOSIS — I8500 Esophageal varices without bleeding: Secondary | ICD-10-CM | POA: Diagnosis not present

## 2017-11-11 DIAGNOSIS — I8511 Secondary esophageal varices with bleeding: Secondary | ICD-10-CM | POA: Diagnosis not present

## 2017-11-11 DIAGNOSIS — R188 Other ascites: Secondary | ICD-10-CM | POA: Diagnosis not present

## 2017-11-11 DIAGNOSIS — K7469 Other cirrhosis of liver: Secondary | ICD-10-CM | POA: Diagnosis not present

## 2017-11-11 DIAGNOSIS — D689 Coagulation defect, unspecified: Secondary | ICD-10-CM | POA: Diagnosis not present

## 2017-11-11 DIAGNOSIS — K7290 Hepatic failure, unspecified without coma: Secondary | ICD-10-CM | POA: Diagnosis not present

## 2017-11-11 DIAGNOSIS — K7581 Nonalcoholic steatohepatitis (NASH): Secondary | ICD-10-CM | POA: Diagnosis not present

## 2017-11-11 DIAGNOSIS — D696 Thrombocytopenia, unspecified: Secondary | ICD-10-CM | POA: Diagnosis not present

## 2017-11-12 DIAGNOSIS — I8511 Secondary esophageal varices with bleeding: Secondary | ICD-10-CM | POA: Diagnosis not present

## 2017-11-12 DIAGNOSIS — R509 Fever, unspecified: Secondary | ICD-10-CM | POA: Diagnosis not present

## 2017-11-12 DIAGNOSIS — R188 Other ascites: Secondary | ICD-10-CM | POA: Diagnosis not present

## 2017-11-12 DIAGNOSIS — K7581 Nonalcoholic steatohepatitis (NASH): Secondary | ICD-10-CM | POA: Diagnosis not present

## 2017-11-12 DIAGNOSIS — D689 Coagulation defect, unspecified: Secondary | ICD-10-CM | POA: Diagnosis not present

## 2017-11-12 DIAGNOSIS — I361 Nonrheumatic tricuspid (valve) insufficiency: Secondary | ICD-10-CM | POA: Diagnosis not present

## 2017-11-13 DIAGNOSIS — I8511 Secondary esophageal varices with bleeding: Secondary | ICD-10-CM | POA: Diagnosis not present

## 2017-11-13 DIAGNOSIS — I10 Essential (primary) hypertension: Secondary | ICD-10-CM | POA: Diagnosis not present

## 2017-11-13 DIAGNOSIS — E119 Type 2 diabetes mellitus without complications: Secondary | ICD-10-CM | POA: Diagnosis not present

## 2017-11-13 DIAGNOSIS — K7581 Nonalcoholic steatohepatitis (NASH): Secondary | ICD-10-CM | POA: Diagnosis not present

## 2017-11-13 MED ORDER — INSULIN LISPRO 100 UNIT/ML ~~LOC~~ SOLN
10.00 | SUBCUTANEOUS | Status: DC
Start: 2017-11-13 — End: 2017-11-13

## 2017-11-13 MED ORDER — MONTELUKAST SODIUM 10 MG PO TABS
10.00 mg | ORAL_TABLET | ORAL | Status: DC
Start: 2017-11-13 — End: 2017-11-13

## 2017-11-13 MED ORDER — GENERIC EXTERNAL MEDICATION
50.00 | Status: DC
Start: 2017-11-13 — End: 2017-11-13

## 2017-11-13 MED ORDER — LEVOTHYROXINE SODIUM 50 MCG PO TABS
50.00 | ORAL_TABLET | ORAL | Status: DC
Start: 2017-11-14 — End: 2017-11-13

## 2017-11-13 MED ORDER — DEXTROSE 50 % IV SOLN
12.50 | INTRAVENOUS | Status: DC
Start: ? — End: 2017-11-13

## 2017-11-13 MED ORDER — FUROSEMIDE 40 MG PO TABS
40.00 mg | ORAL_TABLET | ORAL | Status: DC
Start: 2017-11-14 — End: 2017-11-13

## 2017-11-13 MED ORDER — CIPROFLOXACIN HCL 500 MG PO TABS
500.00 mg | ORAL_TABLET | ORAL | Status: DC
Start: 2017-11-13 — End: 2017-11-13

## 2017-11-13 MED ORDER — SERTRALINE HCL 50 MG PO TABS
50.00 mg | ORAL_TABLET | ORAL | Status: DC
Start: 2017-11-14 — End: 2017-11-13

## 2017-11-13 MED ORDER — GENERIC EXTERNAL MEDICATION
50.00 | Status: DC
Start: ? — End: 2017-11-13

## 2017-11-13 MED ORDER — INSULIN LISPRO 100 UNIT/ML ~~LOC~~ SOLN
0.00 | SUBCUTANEOUS | Status: DC
Start: 2017-11-13 — End: 2017-11-13

## 2017-11-13 MED ORDER — PANTOPRAZOLE SODIUM 40 MG IV SOLR
40.00 mg | INTRAVENOUS | Status: DC
Start: 2017-11-13 — End: 2017-11-13

## 2017-11-13 MED ORDER — ALBUTEROL SULFATE HFA 108 (90 BASE) MCG/ACT IN AERS
2.00 | INHALATION_SPRAY | RESPIRATORY_TRACT | Status: DC
Start: ? — End: 2017-11-13

## 2017-11-13 MED ORDER — SODIUM CHLORIDE 0.9 % IV SOLN
75.00 | INTRAVENOUS | Status: DC
Start: ? — End: 2017-11-13

## 2017-11-20 ENCOUNTER — Inpatient Hospital Stay: Payer: BLUE CROSS/BLUE SHIELD | Admitting: Internal Medicine

## 2017-11-24 ENCOUNTER — Other Ambulatory Visit: Payer: Self-pay | Admitting: Gastroenterology

## 2017-11-30 DIAGNOSIS — M65311 Trigger thumb, right thumb: Secondary | ICD-10-CM | POA: Diagnosis not present

## 2017-12-14 DIAGNOSIS — E1165 Type 2 diabetes mellitus with hyperglycemia: Secondary | ICD-10-CM | POA: Diagnosis not present

## 2017-12-14 DIAGNOSIS — Z7951 Long term (current) use of inhaled steroids: Secondary | ICD-10-CM | POA: Diagnosis not present

## 2017-12-14 DIAGNOSIS — Z794 Long term (current) use of insulin: Secondary | ICD-10-CM | POA: Diagnosis not present

## 2017-12-14 DIAGNOSIS — G4730 Sleep apnea, unspecified: Secondary | ICD-10-CM | POA: Diagnosis not present

## 2017-12-14 DIAGNOSIS — K581 Irritable bowel syndrome with constipation: Secondary | ICD-10-CM | POA: Diagnosis not present

## 2017-12-14 DIAGNOSIS — E441 Mild protein-calorie malnutrition: Secondary | ICD-10-CM | POA: Diagnosis not present

## 2017-12-14 DIAGNOSIS — F411 Generalized anxiety disorder: Secondary | ICD-10-CM | POA: Diagnosis not present

## 2017-12-14 DIAGNOSIS — E669 Obesity, unspecified: Secondary | ICD-10-CM | POA: Diagnosis not present

## 2017-12-14 DIAGNOSIS — K7460 Unspecified cirrhosis of liver: Secondary | ICD-10-CM | POA: Diagnosis not present

## 2017-12-14 DIAGNOSIS — R188 Other ascites: Secondary | ICD-10-CM | POA: Diagnosis not present

## 2017-12-14 DIAGNOSIS — Z6828 Body mass index (BMI) 28.0-28.9, adult: Secondary | ICD-10-CM | POA: Diagnosis not present

## 2017-12-14 DIAGNOSIS — I8511 Secondary esophageal varices with bleeding: Secondary | ICD-10-CM | POA: Diagnosis not present

## 2017-12-14 DIAGNOSIS — I959 Hypotension, unspecified: Secondary | ICD-10-CM | POA: Diagnosis not present

## 2017-12-14 DIAGNOSIS — I10 Essential (primary) hypertension: Secondary | ICD-10-CM | POA: Diagnosis not present

## 2017-12-14 DIAGNOSIS — I8510 Secondary esophageal varices without bleeding: Secondary | ICD-10-CM | POA: Diagnosis not present

## 2017-12-14 DIAGNOSIS — E039 Hypothyroidism, unspecified: Secondary | ICD-10-CM | POA: Diagnosis not present

## 2017-12-14 DIAGNOSIS — K7581 Nonalcoholic steatohepatitis (NASH): Secondary | ICD-10-CM | POA: Diagnosis not present

## 2018-01-06 DIAGNOSIS — M65311 Trigger thumb, right thumb: Secondary | ICD-10-CM | POA: Diagnosis not present

## 2018-01-20 DIAGNOSIS — G4733 Obstructive sleep apnea (adult) (pediatric): Secondary | ICD-10-CM | POA: Diagnosis not present

## 2018-02-02 DIAGNOSIS — G5601 Carpal tunnel syndrome, right upper limb: Secondary | ICD-10-CM | POA: Diagnosis not present

## 2018-02-02 DIAGNOSIS — M65311 Trigger thumb, right thumb: Secondary | ICD-10-CM | POA: Diagnosis not present

## 2018-02-19 DIAGNOSIS — K7460 Unspecified cirrhosis of liver: Secondary | ICD-10-CM | POA: Diagnosis not present

## 2018-02-19 DIAGNOSIS — R161 Splenomegaly, not elsewhere classified: Secondary | ICD-10-CM | POA: Diagnosis not present

## 2018-02-19 DIAGNOSIS — R932 Abnormal findings on diagnostic imaging of liver and biliary tract: Secondary | ICD-10-CM | POA: Diagnosis not present

## 2018-03-05 DIAGNOSIS — Z794 Long term (current) use of insulin: Secondary | ICD-10-CM | POA: Diagnosis not present

## 2018-03-05 DIAGNOSIS — K7460 Unspecified cirrhosis of liver: Secondary | ICD-10-CM | POA: Diagnosis not present

## 2018-03-05 DIAGNOSIS — E1165 Type 2 diabetes mellitus with hyperglycemia: Secondary | ICD-10-CM | POA: Diagnosis not present

## 2018-03-05 DIAGNOSIS — K760 Fatty (change of) liver, not elsewhere classified: Secondary | ICD-10-CM | POA: Diagnosis not present

## 2018-03-05 DIAGNOSIS — E039 Hypothyroidism, unspecified: Secondary | ICD-10-CM | POA: Diagnosis not present

## 2018-03-05 LAB — TSH: TSH: 1.4 (ref 0.41–5.90)

## 2018-03-05 LAB — HEPATIC FUNCTION PANEL
ALK PHOS: 204 — AB (ref 25–125)
ALT: 24 (ref 7–35)
AST: 40 — AB (ref 13–35)

## 2018-03-05 LAB — HEMOGLOBIN A1C: HEMOGLOBIN A1C: 13.2

## 2018-03-08 DIAGNOSIS — E1165 Type 2 diabetes mellitus with hyperglycemia: Secondary | ICD-10-CM | POA: Diagnosis not present

## 2018-03-08 DIAGNOSIS — E039 Hypothyroidism, unspecified: Secondary | ICD-10-CM | POA: Diagnosis not present

## 2018-03-08 DIAGNOSIS — Z794 Long term (current) use of insulin: Secondary | ICD-10-CM | POA: Diagnosis not present

## 2018-03-30 DIAGNOSIS — M6530 Trigger finger, unspecified finger: Secondary | ICD-10-CM | POA: Diagnosis not present

## 2018-04-14 ENCOUNTER — Encounter: Payer: Self-pay | Admitting: Internal Medicine

## 2018-04-14 ENCOUNTER — Other Ambulatory Visit: Payer: Self-pay | Admitting: Gastroenterology

## 2018-04-14 ENCOUNTER — Other Ambulatory Visit: Payer: Self-pay | Admitting: Internal Medicine

## 2018-04-23 DIAGNOSIS — Z1231 Encounter for screening mammogram for malignant neoplasm of breast: Secondary | ICD-10-CM | POA: Diagnosis not present

## 2018-04-27 IMAGING — US US ABDOMEN LIMITED
1 series · 14 of 25 positions shown · non-contrast
Comparison: None.

CLINICAL DATA: Hepatic cirrhosis.

EXAM:
US ABDOMEN LIMITED - RIGHT UPPER QUADRANT

[Series 1: us abdomen limited · 0.19mm/px · 14 of 47 slices shown]
[im 1/47]
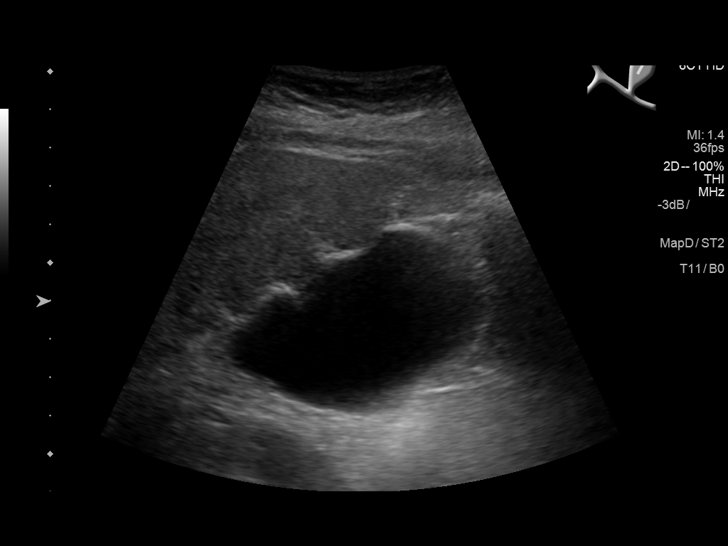
[im 4/47]
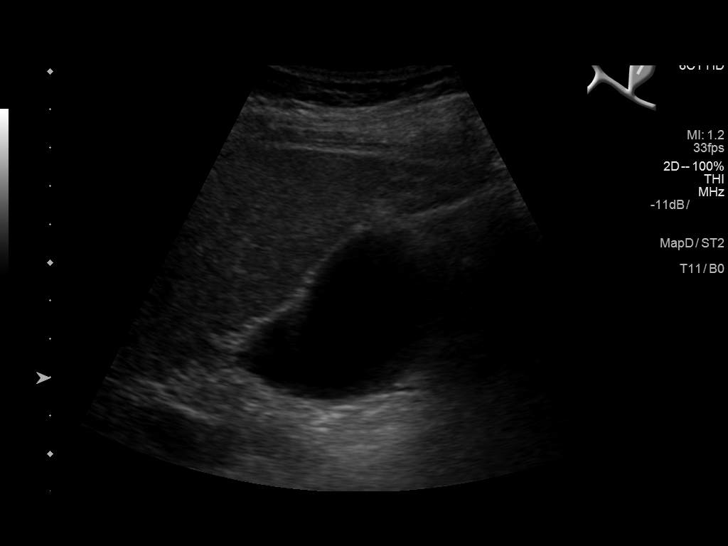
[im 8/47]
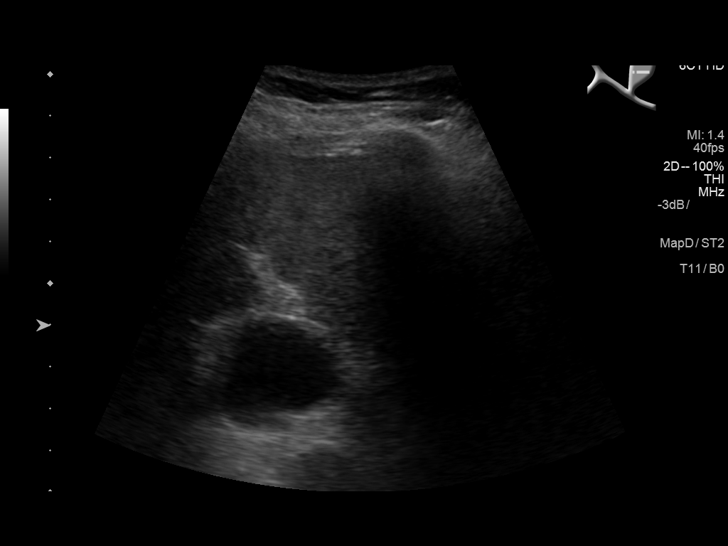
[im 12/47]
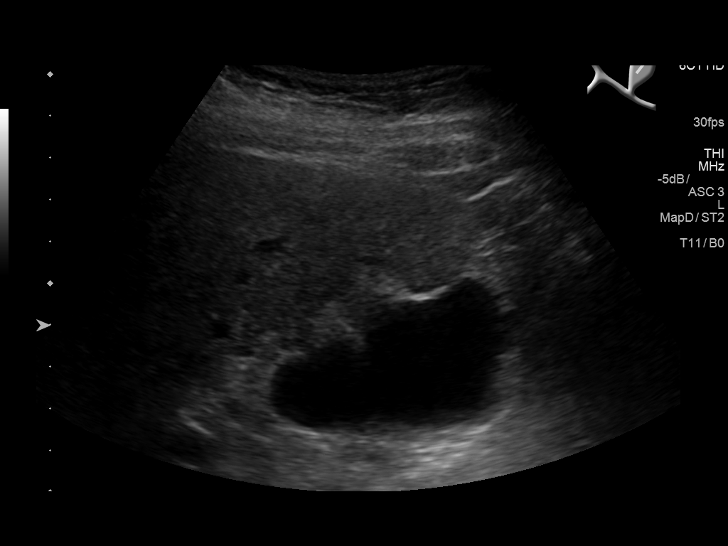
[im 16/47]
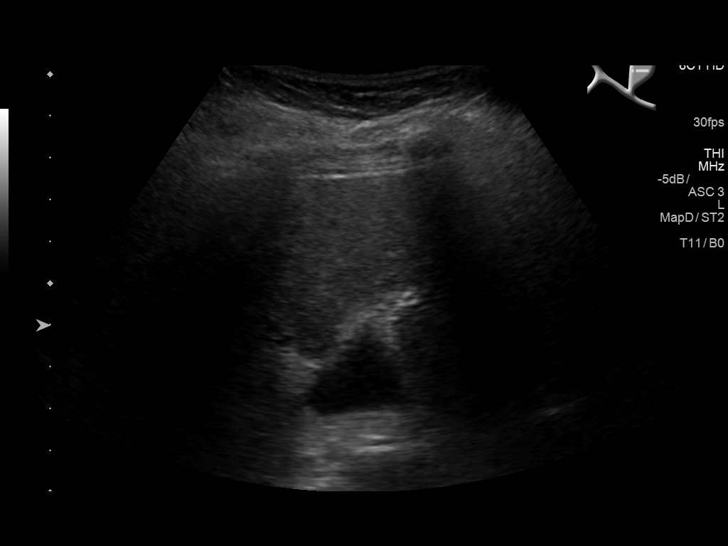
[im 18/47]
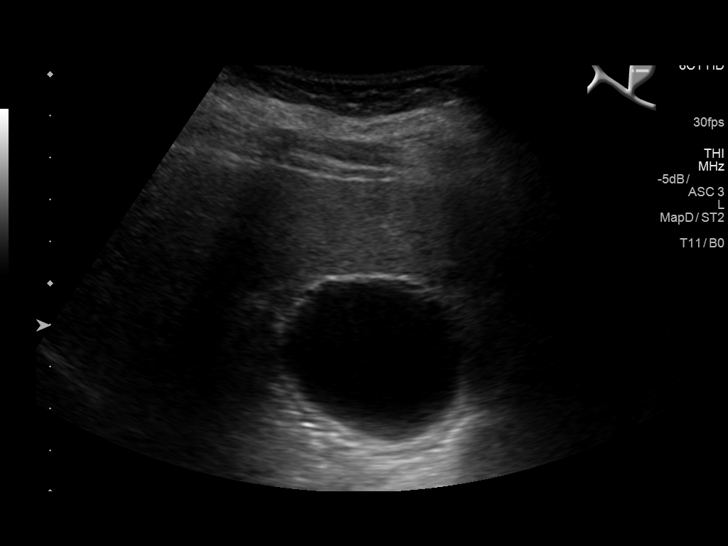
[im 22/47]
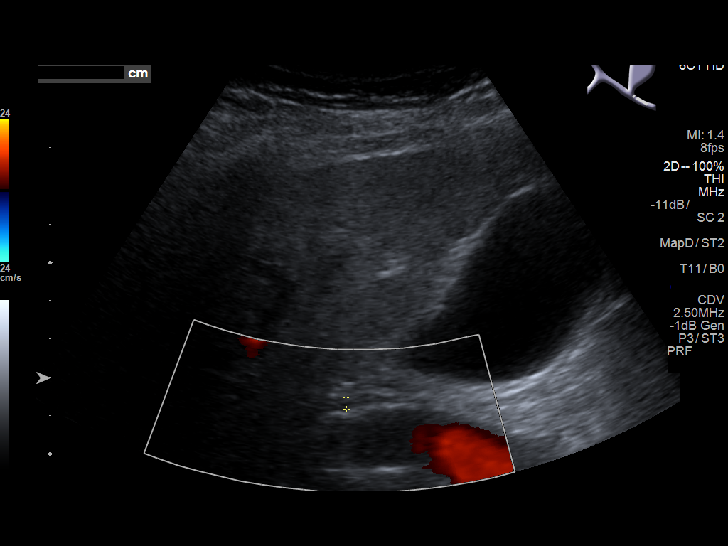
[im 25/47]
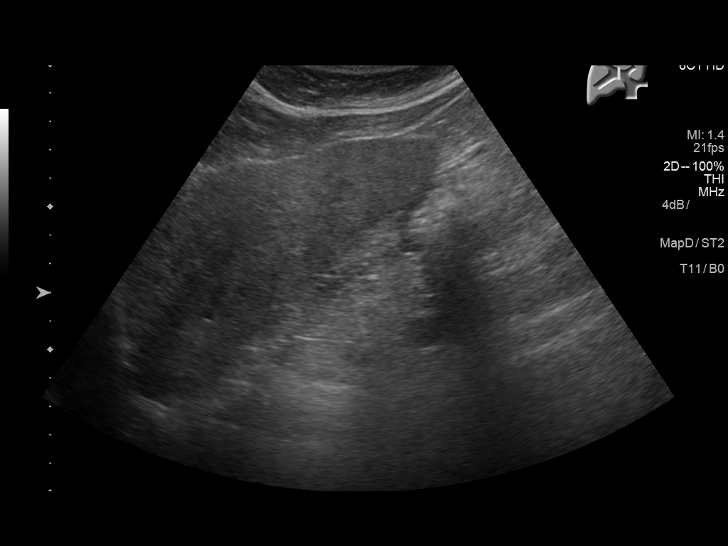
[im 29/47]
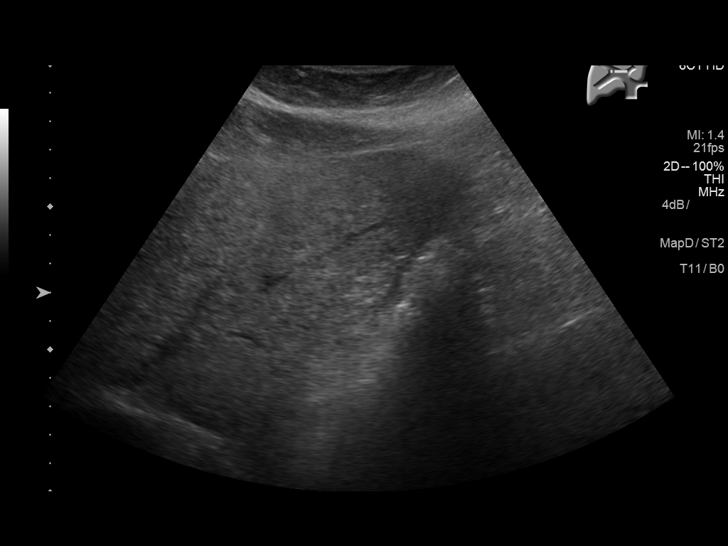
[im 31/47]
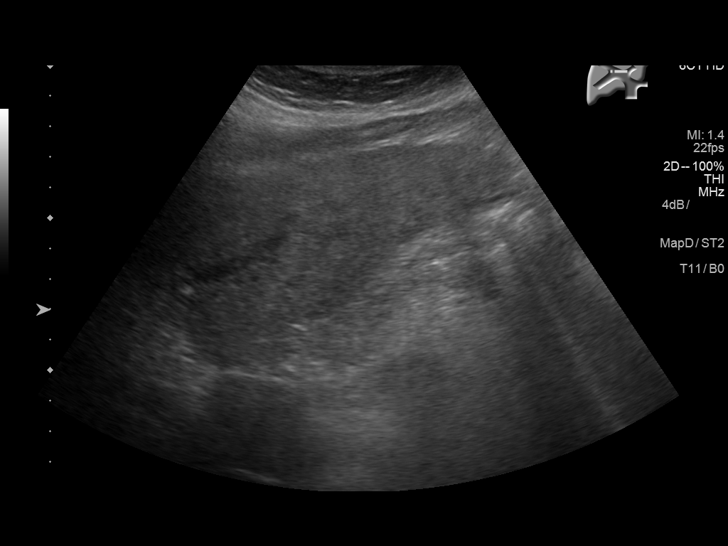
[im 35/47]
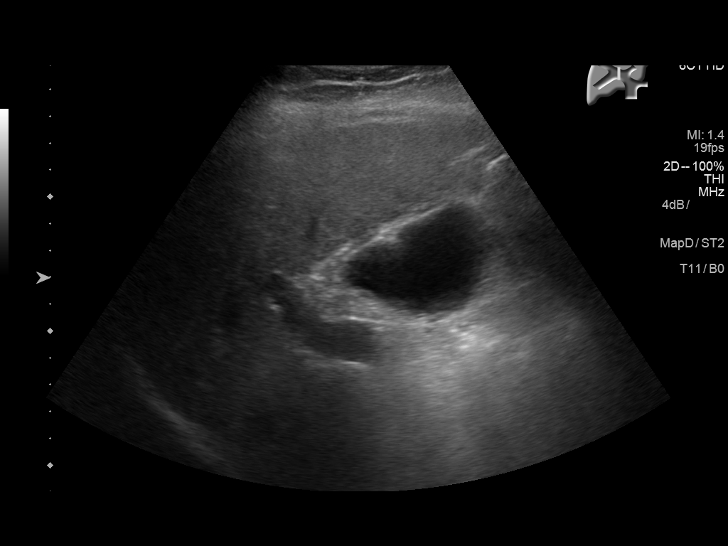
[im 39/47]
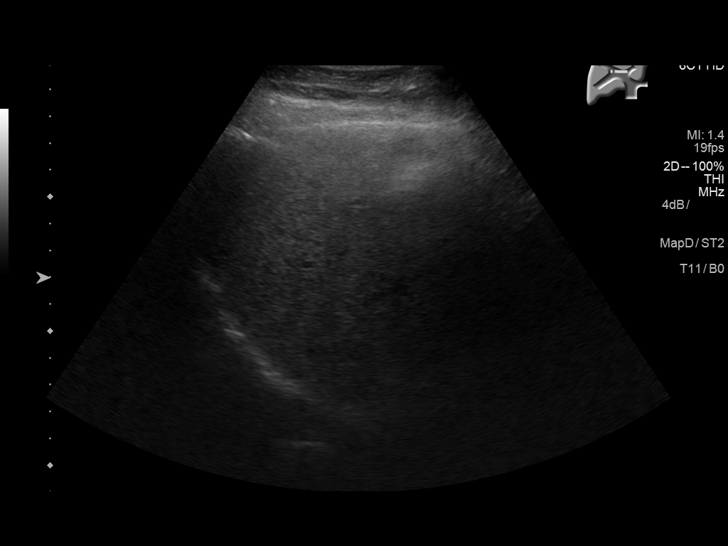
[im 43/47]
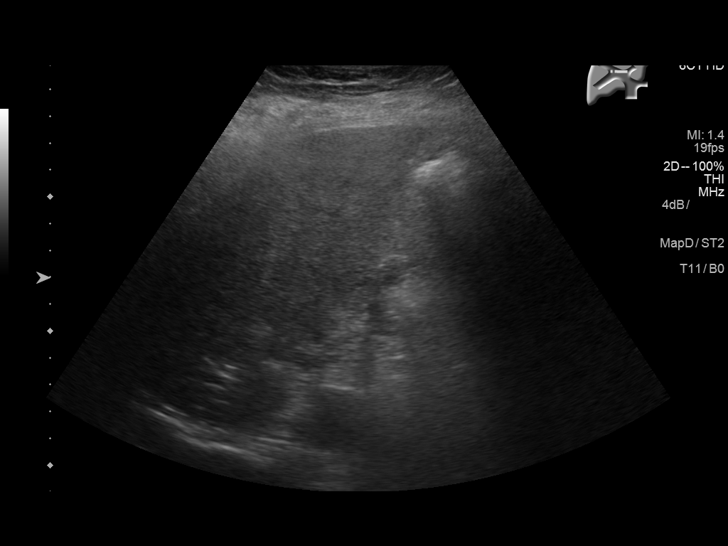
[im 47/47]
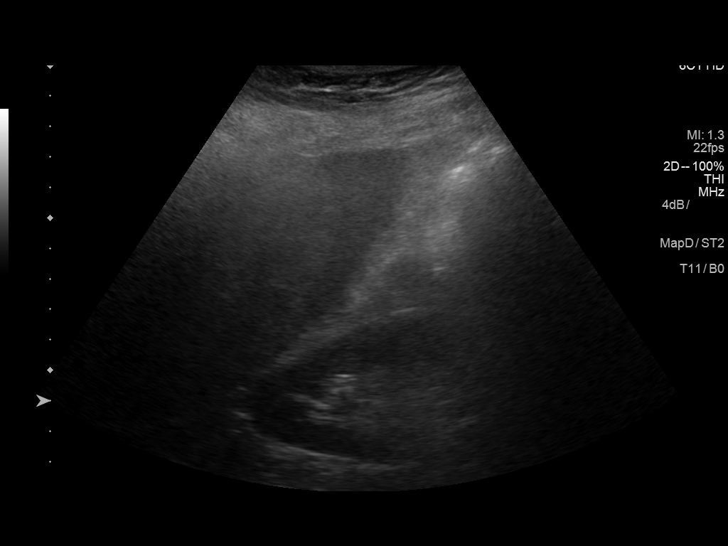

[14 of 25 positions shown; findings below may reference images not displayed]

FINDINGS: Gallbladder:

No gallstones or wall thickening visualized. No sonographic Murphy
sign noted by sonographer.

Common bile duct:

Diameter: 3 mm which is within normal limits.

Liver:

No focal lesion identified. Heterogeneous echotexture of hepatic
parenchyma is noted with nodular hepatic contours concerning for
hepatic cirrhosis.
IMPRESSION: Findings consistent with hepatic cirrhosis. No focal abnormality is
noted within the hepatic parenchyma. No other abnormality seen in
the right upper quadrant of the abdomen.

## 2018-05-06 DIAGNOSIS — Z794 Long term (current) use of insulin: Secondary | ICD-10-CM | POA: Diagnosis not present

## 2018-05-06 DIAGNOSIS — E039 Hypothyroidism, unspecified: Secondary | ICD-10-CM | POA: Diagnosis not present

## 2018-05-06 DIAGNOSIS — E1165 Type 2 diabetes mellitus with hyperglycemia: Secondary | ICD-10-CM | POA: Diagnosis not present

## 2018-05-06 DIAGNOSIS — E119 Type 2 diabetes mellitus without complications: Secondary | ICD-10-CM | POA: Diagnosis not present

## 2018-05-10 DIAGNOSIS — S01502A Unspecified open wound of oral cavity, initial encounter: Secondary | ICD-10-CM | POA: Diagnosis not present

## 2018-05-12 DIAGNOSIS — S01502A Unspecified open wound of oral cavity, initial encounter: Secondary | ICD-10-CM | POA: Diagnosis not present

## 2018-05-19 ENCOUNTER — Other Ambulatory Visit: Payer: Self-pay | Admitting: Internal Medicine

## 2018-05-20 ENCOUNTER — Encounter: Payer: Self-pay | Admitting: Internal Medicine

## 2018-05-27 DIAGNOSIS — S01502A Unspecified open wound of oral cavity, initial encounter: Secondary | ICD-10-CM | POA: Diagnosis not present

## 2018-06-07 DIAGNOSIS — R319 Hematuria, unspecified: Secondary | ICD-10-CM | POA: Diagnosis not present

## 2018-06-07 DIAGNOSIS — N390 Urinary tract infection, site not specified: Secondary | ICD-10-CM | POA: Diagnosis not present

## 2018-06-07 DIAGNOSIS — J019 Acute sinusitis, unspecified: Secondary | ICD-10-CM | POA: Diagnosis not present

## 2018-06-07 DIAGNOSIS — R350 Frequency of micturition: Secondary | ICD-10-CM | POA: Diagnosis not present

## 2018-06-10 DIAGNOSIS — M65341 Trigger finger, right ring finger: Secondary | ICD-10-CM | POA: Diagnosis not present

## 2018-06-10 DIAGNOSIS — M65331 Trigger finger, right middle finger: Secondary | ICD-10-CM | POA: Diagnosis not present

## 2018-06-10 DIAGNOSIS — M65311 Trigger thumb, right thumb: Secondary | ICD-10-CM | POA: Diagnosis not present

## 2018-06-14 DIAGNOSIS — M6530 Trigger finger, unspecified finger: Secondary | ICD-10-CM | POA: Diagnosis not present

## 2018-06-14 DIAGNOSIS — K7460 Unspecified cirrhosis of liver: Secondary | ICD-10-CM | POA: Diagnosis not present

## 2018-06-14 DIAGNOSIS — R161 Splenomegaly, not elsewhere classified: Secondary | ICD-10-CM | POA: Diagnosis not present

## 2018-06-14 DIAGNOSIS — K7581 Nonalcoholic steatohepatitis (NASH): Secondary | ICD-10-CM | POA: Diagnosis not present

## 2018-06-14 DIAGNOSIS — E669 Obesity, unspecified: Secondary | ICD-10-CM | POA: Diagnosis not present

## 2018-06-14 DIAGNOSIS — I10 Essential (primary) hypertension: Secondary | ICD-10-CM | POA: Diagnosis not present

## 2018-06-14 DIAGNOSIS — E46 Unspecified protein-calorie malnutrition: Secondary | ICD-10-CM | POA: Diagnosis not present

## 2018-06-14 DIAGNOSIS — Z6828 Body mass index (BMI) 28.0-28.9, adult: Secondary | ICD-10-CM | POA: Diagnosis not present

## 2018-06-14 DIAGNOSIS — D6949 Other primary thrombocytopenia: Secondary | ICD-10-CM | POA: Diagnosis not present

## 2018-06-14 DIAGNOSIS — M810 Age-related osteoporosis without current pathological fracture: Secondary | ICD-10-CM | POA: Diagnosis not present

## 2018-06-14 DIAGNOSIS — Z794 Long term (current) use of insulin: Secondary | ICD-10-CM | POA: Diagnosis not present

## 2018-06-14 DIAGNOSIS — E1165 Type 2 diabetes mellitus with hyperglycemia: Secondary | ICD-10-CM | POA: Diagnosis not present

## 2018-06-14 DIAGNOSIS — Z79899 Other long term (current) drug therapy: Secondary | ICD-10-CM | POA: Diagnosis not present

## 2018-06-21 ENCOUNTER — Other Ambulatory Visit: Payer: Self-pay | Admitting: Internal Medicine

## 2018-06-21 ENCOUNTER — Other Ambulatory Visit: Payer: Self-pay | Admitting: Gastroenterology

## 2018-06-21 DIAGNOSIS — E1165 Type 2 diabetes mellitus with hyperglycemia: Secondary | ICD-10-CM | POA: Diagnosis not present

## 2018-06-21 DIAGNOSIS — E039 Hypothyroidism, unspecified: Secondary | ICD-10-CM | POA: Diagnosis not present

## 2018-06-21 DIAGNOSIS — K760 Fatty (change of) liver, not elsewhere classified: Secondary | ICD-10-CM | POA: Diagnosis not present

## 2018-06-21 DIAGNOSIS — E103291 Type 1 diabetes mellitus with mild nonproliferative diabetic retinopathy without macular edema, right eye: Secondary | ICD-10-CM | POA: Diagnosis not present

## 2018-06-21 DIAGNOSIS — Z794 Long term (current) use of insulin: Secondary | ICD-10-CM | POA: Diagnosis not present

## 2018-06-21 LAB — HEMOGLOBIN A1C: Hemoglobin A1C: 11.8

## 2018-06-28 ENCOUNTER — Ambulatory Visit
Admission: EM | Admit: 2018-06-28 | Discharge: 2018-06-28 | Disposition: A | Discharge status: Home / Self Care | Payer: BLUE CROSS/BLUE SHIELD | Attending: Internal Medicine | Admitting: Internal Medicine

## 2018-06-28 ENCOUNTER — Other Ambulatory Visit: Payer: Self-pay

## 2018-06-28 DIAGNOSIS — J209 Acute bronchitis, unspecified: Secondary | ICD-10-CM

## 2018-06-28 DIAGNOSIS — J9801 Acute bronchospasm: Secondary | ICD-10-CM | POA: Diagnosis not present

## 2018-06-28 MED ORDER — METHYLPREDNISOLONE SODIUM SUCC 125 MG IJ SOLR
125.0000 mg | Freq: Once | INTRAMUSCULAR | Status: AC
  Administered 2018-06-28: 125 mg via INTRAMUSCULAR

## 2018-06-28 MED ORDER — IPRATROPIUM-ALBUTEROL 0.5-2.5 (3) MG/3ML IN SOLN
3.0000 mL | Freq: Once | RESPIRATORY_TRACT | Status: AC
  Administered 2018-06-28: 3 mL via RESPIRATORY_TRACT

## 2018-06-28 MED ORDER — PREDNISONE 50 MG PO TABS: 50.0000 mg | ORAL_TABLET | Freq: Every day | ORAL | 0 refills | Status: DC

## 2018-06-28 MED ORDER — BENZONATATE 200 MG PO CAPS: 200.0000 mg | ORAL_CAPSULE | Freq: Three times a day (TID) | ORAL | 1 refills | Status: AC | PRN

## 2018-06-28 NOTE — ED Triage Notes (Signed)
Patient complains of cough x intermittently for 2.5 weeks. Patient states that cough worsened over last 2 days and she has been noticing her chest hurting after coughing.

## 2018-06-28 NOTE — Discharge Instructions (Addendum)
Anticipate gradual improvement in wheezing, cough, over the next several days.  Injection of solumedrol (steroid) given at the urgent care with an albuterol/ipratropium breathing treatment for cough.  Prescription for prednisone and for tessalon perles for cough were sent to the pharmacy.  Recheck for new fever >100.5, increasing phlegm production/nasal discharge, or if not starting to improve in a few days.

## 2018-06-28 NOTE — ED Provider Notes (Signed)
[facility]    CSN: [id number] Arrival date & time: 06/28/18  1714     History   Chief Complaint Chief Complaint  Patient presents with  . Cough    HPI Amber Odonnell is a 47 y.o. female.   She presents today with about 2-1/2 weeks of upper respiratory symptoms, started with head congestion, sinus drainage, just finished a round of Augmentin for the symptoms 3 to 4 days ago.  In the last 2 days, has had increased wheezy coughing, coughing spells, and now her chest is hurting centrally when she coughs.  No fever.  Does have a history of asthma, had been well-controlled for several years.  Some runny nose, cough is mostly dry but is productive at times.  No nausea/vomiting, no diarrhea. Past history is notable for diabetes and fatty liver which has produced cirrhosis.    HPI  Past Medical History:  Diagnosis Date  . Arthritis    of knee  . Asthma   . Chronic gastritis   . Cirrhosis of liver without ascites ([place])   . Diabetes mellitus without complication ([employer])    type 2  . Dyspnea   . Generalized anxiety disorder   . GERD (gastroesophageal reflux disease)   . Hyperlipidemia   . Hypertension   . Hypothyroidism   . Irritable bowel syndrome (IBS)    w/ constipation  . Obesity (BMI 30-39.9)   . Portal hypertension ([place])   . Sleep apnea    CPAP  . Splenomegaly   . Thrombocytopenia [facility])     Patient Active Problem List   Diagnosis Date Noted  . Pneumonia 06/30/2018  . Iron deficiency anemia secondary to blood loss (chronic)   . Secondary esophageal varices without bleeding ([place])   . Renal insufficiency 07/14/2017  . Anemia 07/14/2017  . Enlarged pituitary gland ([place]) 07/10/2017  . Headache syndrome 07/10/2017  . Secondary esophageal varices with bleeding ([place])   . Vaginal flatus 03/13/2017  . Eye discharge 03/13/2017  . Esophageal varices determined by endoscopy ([place]) 03/11/2017  . Abnormal feces   . Polyp of sigmoid colon   . Gastritis without  bleeding   . Environmental and seasonal allergies 01/21/2017  . Type 2 diabetes mellitus without complication, with long-term current use of insulin ([place]) 01/21/2017  . Hypothyroidism due to acquired atrophy of thyroid 01/21/2017  . Hyperlipidemia associated with type 2 diabetes mellitus ([place]) 01/21/2017  . Irritable bowel syndrome with constipation 01/21/2017  . Obstructive sleep apnea syndrome 01/21/2017  . Thrombocytopenia ([place])   . Splenomegaly   . Portal hypertension ([place])   . Obesity (BMI 30-39.9)   . Essential hypertension   . Hyperlipidemia   . Generalized anxiety disorder   . Hepatic cirrhosis ([place])   . Chronic gastritis   . Arthritis     Past Surgical History:  Procedure Laterality Date  . COLONOSCOPY  10/08/2017   No polyps or mass [facility])  . COLONOSCOPY WITH PROPOFOL N/A 07/24/2017   Procedure: COLONOSCOPY WITH PROPOFOL;  Surgeon: [name], MD;  Location: [facility];  Service: Endoscopy;  Laterality: N/A;  . ESOPHAGOGASTRODUODENOSCOPY (EGD) WITH PROPOFOL N/A 02/26/2017   Procedure: ESOPHAGOGASTRODUODENOSCOPY (EGD) WITH Banding;  Surgeon: [name], MD;  Location: [place];  Service: Endoscopy;  Laterality: N/A;  . ESOPHAGOGASTRODUODENOSCOPY (EGD) WITH PROPOFOL N/A 04/10/2017   Procedure: ESOPHAGOGASTRODUODENOSCOPY (EGD) WITH PROPOFOL;  Surgeon: [name], MD;  Location: [place];  Service: Endoscopy;  Laterality: N/A;  Diabetic  sleep apnea  with banding  .  ESOPHAGOGASTRODUODENOSCOPY (EGD) WITH PROPOFOL N/A 07/24/2017   Procedure: ESOPHAGOGASTRODUODENOSCOPY (EGD) WITH PROPOFOL;  Surgeon: [name], MD;  Location: [place];  Service: Endoscopy;  Laterality: N/A;  Diabetic - insulin  sleep apnea  . TONSILLECTOMY    . TOTAL VAGINAL HYSTERECTOMY     endometriosis    OB History    Gravida  2   Para  1   Term  1   Preterm      AB  1   Living  1     SAB  1   TAB      Ectopic      Multiple      Live  Births               Home Medications    Prior to Admission medications   Medication Sig Start Date End Date Taking? Authorizing Provider  B Complex Vitamins (B-COMPLEX/B-12 SL) Place 1 tablet under the tongue daily.    Yes [provider]  furosemide (LASIX) 40 MG tablet Take 1 tablet (40 mg total) by mouth daily. 04/10/17 06/28/18 Yes [name], MD  insulin aspart (NOVOLOG FLEXPEN) 100 UNIT/ML FlexPen Inject 60 Units into the skin 3 (three) times daily with meals. 06/09/17  Yes Glean Hess, MD  Insulin Degludec (TRESIBA FLEXTOUCH [place]) Inject 92 Units into the skin at bedtime.    Yes [provider]  levothyroxine (SYNTHROID, LEVOTHROID) 50 MCG tablet TAKE 1 TABLET(50 MCG) BY MOUTH DAILY BEFORE BREAKFAST Patient taking differently: Take 50 mcg by mouth daily before breakfast.  06/22/18  Yes Glean Hess, MD  MEGARED OMEGA-3 KRILL OIL PO Take 1 tablet by mouth at bedtime.    Yes [provider]  Multiple Vitamins-Minerals (ALIVE ONCE DAILY WOMENS 50+ PO) Take 1 tablet by mouth daily.    Yes [provider]  nadolol (CORGARD) 20 MG tablet Take 20 mg by mouth daily.  12/14/17  Yes [provider]  benzonatate (TESSALON) 200 MG capsule Take 1 capsule (200 mg total) by mouth 3 (three) times daily as needed for cough. 06/28/18   Wynona Luna, MD  fluticasone [facility]) 50 MCG/ACT nasal spray Place 2 sprays into both nostrils daily as needed for allergies.     [provider]  losartan (COZAAR) 50 MG tablet Take 1 tablet (50 mg total) by mouth daily. May take 1/2 twice a day Patient not taking: Reported on 06/30/2018 07/14/17   Glean Hess, MD  magic mouthwash SOLN Take 5 mLs by mouth 4 (four) times daily. 07/31/17   Glean Hess, MD  montelukast (SINGULAIR) 10 MG tablet TAKE 1 TABLET(10 MG) BY MOUTH AT BEDTIME Patient not taking: Reported on 06/30/2018 11/07/17   Glean Hess, MD  pantoprazole (PROTONIX) 40 MG tablet  TAKE 1 TABLET BY MOUTH EVERY DAY 11/24/17   [name], MD  predniSONE (DELTASONE) 50 MG tablet Take 1 tablet (50 mg total) by mouth daily. 06/28/18   Wynona Luna, MD  sertraline (ZOLOFT) 50 MG tablet TAKE 1 AND 1/2 TABLETS(75 MG) BY MOUTH DAILY Patient not taking: Reported on 06/30/2018 04/14/18   Glean Hess, MD    Family History Family History  Problem Relation Age of Onset  . Breast cancer Mother   . Diabetes Mother   . Diabetes Father   . Hypertension Father   . Cancer Paternal Grandmother     Social History Social History   Tobacco Use  . Smoking status: Never Smoker  .  Smokeless tobacco: Never Used  Substance Use Topics  . Alcohol use: No  . Drug use: No     Allergies   Hydrocodone   Review of Systems Review of Systems  All other systems reviewed and are negative.    Physical Exam Triage Vital Signs ED Triage Vitals  Enc Vitals Group     BP 06/28/18 1729 (!) 143/74     Pulse Rate 06/28/18 1729 88     Resp 06/28/18 1729 18     Temp 06/28/18 1729 97.8 F (36.6 C)     Temp Source 06/28/18 1729 Oral     SpO2 06/28/18 1729 100 %     Weight 06/28/18 1724 160 lb (72.6 kg)     Height 06/28/18 1724 5\' 3"  (1.6 m)     Pain Score 06/28/18 1724 8     Pain Loc --    Updated Vital Signs BP (!) 143/74 (BP Location: Left Arm)   Pulse 88   Temp 97.8 F (36.6 C) (Oral)   Resp 18   Ht 5\' 3"  (1.6 m)   Wt 72.6 kg   SpO2 100%   BMI 28.34 kg/m  Physical Exam  Constitutional: She is oriented to person, place, and time.  Very frequent coughing, coughing spasms.  Looks miserable  HENT:  Head: Atraumatic.  Bilateral TMs are dull, red-tinged Moderate nasal congestion laterally, with excoriation of the septum present Posterior pharynx is injected, with scant postnasal drainage evident  Eyes:  Conjugate gaze observed, no eye redness/discharge  Neck: Neck supple.  Cardiovascular: Regular rhythm.  Heart rate 100s, in the middle of a coughing spell    Pulmonary/Chest: No respiratory distress.  Coarse but symmetric breath sounds throughout with wheezing, slightly decreased air excursion posteriorly  Abdominal: She exhibits no distension.  Musculoskeletal: Normal range of motion.  Neurological: She is alert and oriented to person, place, and time.  Skin: Skin is warm and dry.  Nursing note and vitals reviewed.    UC Treatments / Results   Procedures Procedures (including critical care time)  Medications Ordered in UC Medications  methylPREDNISolone sodium succinate (SOLU-MEDROL) 125 mg/2 mL injection 125 mg (125 mg Intramuscular Given 06/28/18 1802)  ipratropium-albuterol (DUONEB) 0.5-2.5 (3) MG/3ML nebulizer solution 3 mL (3 mLs Nebulization Given 06/28/18 1802)    Final Clinical Impressions(s) / UC Diagnoses   Final diagnoses:  Bronchitis with bronchospasm     Discharge Instructions     Anticipate gradual improvement in wheezing, cough, over the next several days.  Injection of solumedrol (steroid) given at the urgent care with an albuterol/ipratropium breathing treatment for cough.  Prescription for prednisone and for tessalon perles for cough were sent to the pharmacy.  Recheck for new fever >100.5, increasing phlegm production/nasal discharge, or if not starting to improve in a few days.       ED Prescriptions    Medication Sig Dispense Auth. Provider   predniSONE (DELTASONE) 50 MG tablet Take 1 tablet (50 mg total) by mouth daily. 3 tablet Wynona Luna, MD   benzonatate (TESSALON) 200 MG capsule Take 1 capsule (200 mg total) by mouth 3 (three) times daily as needed for cough. 30 capsule Wynona Luna, MD        Wynona Luna, MD 07/01/18 [id number]

## 2018-06-30 ENCOUNTER — Emergency Department: Payer: BLUE CROSS/BLUE SHIELD

## 2018-06-30 ENCOUNTER — Other Ambulatory Visit: Payer: Self-pay

## 2018-06-30 ENCOUNTER — Inpatient Hospital Stay
Admission: EM | Admit: 2018-06-30 | Discharge: 2018-07-02 | DRG: 194 | Disposition: A | Discharge status: Home / Self Care | Payer: BLUE CROSS/BLUE SHIELD | Attending: Internal Medicine | Admitting: Internal Medicine

## 2018-06-30 ENCOUNTER — Encounter: Payer: Self-pay | Admitting: Emergency Medicine

## 2018-06-30 DIAGNOSIS — Z8249 Family history of ischemic heart disease and other diseases of the circulatory system: Secondary | ICD-10-CM | POA: Diagnosis not present

## 2018-06-30 DIAGNOSIS — R0902 Hypoxemia: Secondary | ICD-10-CM

## 2018-06-30 DIAGNOSIS — Z833 Family history of diabetes mellitus: Secondary | ICD-10-CM

## 2018-06-30 DIAGNOSIS — J45909 Unspecified asthma, uncomplicated: Secondary | ICD-10-CM | POA: Diagnosis present

## 2018-06-30 DIAGNOSIS — E1165 Type 2 diabetes mellitus with hyperglycemia: Secondary | ICD-10-CM | POA: Diagnosis present

## 2018-06-30 DIAGNOSIS — R0602 Shortness of breath: Secondary | ICD-10-CM | POA: Diagnosis not present

## 2018-06-30 DIAGNOSIS — Z803 Family history of malignant neoplasm of breast: Secondary | ICD-10-CM | POA: Diagnosis not present

## 2018-06-30 DIAGNOSIS — D696 Thrombocytopenia, unspecified: Secondary | ICD-10-CM | POA: Diagnosis not present

## 2018-06-30 DIAGNOSIS — R079 Chest pain, unspecified: Secondary | ICD-10-CM

## 2018-06-30 DIAGNOSIS — F411 Generalized anxiety disorder: Secondary | ICD-10-CM | POA: Diagnosis present

## 2018-06-30 DIAGNOSIS — J181 Lobar pneumonia, unspecified organism: Secondary | ICD-10-CM

## 2018-06-30 DIAGNOSIS — E039 Hypothyroidism, unspecified: Secondary | ICD-10-CM | POA: Diagnosis present

## 2018-06-30 DIAGNOSIS — I10 Essential (primary) hypertension: Secondary | ICD-10-CM | POA: Diagnosis not present

## 2018-06-30 DIAGNOSIS — K766 Portal hypertension: Secondary | ICD-10-CM | POA: Diagnosis present

## 2018-06-30 DIAGNOSIS — K7460 Unspecified cirrhosis of liver: Secondary | ICD-10-CM | POA: Diagnosis not present

## 2018-06-30 DIAGNOSIS — J189 Pneumonia, unspecified organism: Principal | ICD-10-CM | POA: Diagnosis present

## 2018-06-30 DIAGNOSIS — D6959 Other secondary thrombocytopenia: Secondary | ICD-10-CM | POA: Diagnosis present

## 2018-06-30 DIAGNOSIS — K219 Gastro-esophageal reflux disease without esophagitis: Secondary | ICD-10-CM | POA: Diagnosis present

## 2018-06-30 DIAGNOSIS — Z794 Long term (current) use of insulin: Secondary | ICD-10-CM

## 2018-06-30 LAB — CBC
HEMATOCRIT: 32.9 % — AB (ref 35.0–47.0)
HEMOGLOBIN: 11 g/dL — AB (ref 12.0–16.0)
MCH: 28 pg (ref 26.0–34.0)
MCHC: 33.4 g/dL (ref 32.0–36.0)
MCV: 83.6 fL (ref 80.0–100.0)
Platelets: 67 10*3/uL — ABNORMAL LOW (ref 150–440)
RBC: 3.93 MIL/uL (ref 3.80–5.20)
RDW: 18.9 % — ABNORMAL HIGH (ref 11.5–14.5)
WBC: 6.4 10*3/uL (ref 3.6–11.0)

## 2018-06-30 LAB — GLUCOSE, CAPILLARY
GLUCOSE-CAPILLARY: 378 mg/dL — AB (ref 70–99)
GLUCOSE-CAPILLARY: 337 mg/dL — AB (ref 70–99)
Glucose-Capillary: 318 mg/dL — ABNORMAL HIGH (ref 70–99)

## 2018-06-30 LAB — BASIC METABOLIC PANEL
ANION GAP: 10 (ref 5–15)
BUN: 8 mg/dL (ref 6–20)
CHLORIDE: 102 mmol/L (ref 98–111)
CO2: 23 mmol/L (ref 22–32)
Calcium: 8.8 mg/dL — ABNORMAL LOW (ref 8.9–10.3)
Creatinine, Ser: 0.62 mg/dL (ref 0.44–1.00)
GFR calc Af Amer: >60 mL/min (ref >60)
GFR calc non Af Amer: >60 mL/min (ref >60)
GLUCOSE: 447 mg/dL — AB (ref 70–99)
POTASSIUM: 4 mmol/L (ref 3.5–5.1)
Sodium: 135 mmol/L (ref 135–145)

## 2018-06-30 LAB — TROPONIN I: Troponin I: <0.03 ng/mL (ref <0.03)

## 2018-06-30 LAB — POCT CBG MONITORING: CBG: 378

## 2018-06-30 LAB — FIBRIN DERIVATIVES D-DIMER (ARMC ONLY): Fibrin derivatives D-dimer (ARMC): 477.59 ng{FEU}/mL (ref 0.00–499.00)

## 2018-06-30 MED ORDER — SERTRALINE HCL 50 MG PO TABS
50.0000 mg | ORAL_TABLET | Freq: Every day | ORAL | Status: DC
  Filled 2018-06-30: qty 1

## 2018-06-30 MED ORDER — SODIUM CHLORIDE 0.9% FLUSH: 3.0000 mL | INTRAVENOUS | Status: DC | PRN

## 2018-06-30 MED ORDER — CHLORHEXIDINE GLUCONATE 0.12 % MT SOLN
15.0000 mL | Freq: Two times a day (BID) | OROMUCOSAL | Status: DC
  Administered 2018-06-30 – 2018-07-02 (×4): 15 mL via OROMUCOSAL
  Filled 2018-06-30 (×4): qty 15

## 2018-06-30 MED ORDER — NADOLOL 20 MG PO TABS
20.0000 mg | ORAL_TABLET | Freq: Every evening | ORAL | Status: DC
  Administered 2018-06-30: 20 mg via ORAL
  Filled 2018-06-30 (×3): qty 1

## 2018-06-30 MED ORDER — LEVOFLOXACIN IN D5W 500 MG/100ML IV SOLN
500.0000 mg | Freq: Every day | INTRAVENOUS | Status: DC
  Administered 2018-07-01 – 2018-07-02 (×2): 500 mg via INTRAVENOUS
  Filled 2018-06-30 (×2): qty 100

## 2018-06-30 MED ORDER — MONTELUKAST SODIUM 10 MG PO TABS
10.0000 mg | ORAL_TABLET | Freq: Every day | ORAL | Status: DC
  Administered 2018-06-30 – 2018-07-01 (×2): 10 mg via ORAL
  Filled 2018-06-30 (×2): qty 1

## 2018-06-30 MED ORDER — INSULIN ASPART 100 UNIT/ML ~~LOC~~ SOLN
0.0000 [IU] | Freq: Every day | SUBCUTANEOUS | Status: DC
  Administered 2018-06-30: 23:00:00 4 [IU] via SUBCUTANEOUS
  Administered 2018-07-01: 3 [IU] via SUBCUTANEOUS
  Filled 2018-06-30 (×2): qty 1

## 2018-06-30 MED ORDER — INSULIN ASPART 100 UNIT/ML ~~LOC~~ SOLN
5.0000 [IU] | Freq: Once | SUBCUTANEOUS | Status: AC
  Administered 2018-06-30: 5 [IU] via SUBCUTANEOUS
  Filled 2018-06-30: qty 1

## 2018-06-30 MED ORDER — INSULIN GLARGINE 100 UNIT/ML ~~LOC~~ SOLN
15.0000 [IU] | Freq: Two times a day (BID) | SUBCUTANEOUS | Status: DC
  Administered 2018-06-30 – 2018-07-01 (×3): 15 [IU] via SUBCUTANEOUS
  Filled 2018-06-30 (×5): qty 0.15

## 2018-06-30 MED ORDER — ONDANSETRON HCL 4 MG/2ML IJ SOLN: 4.0000 mg | Freq: Four times a day (QID) | INTRAMUSCULAR | Status: DC | PRN

## 2018-06-30 MED ORDER — BENZONATATE 100 MG PO CAPS
200.0000 mg | ORAL_CAPSULE | Freq: Three times a day (TID) | ORAL | Status: DC | PRN
  Administered 2018-07-01: 200 mg via ORAL
  Filled 2018-06-30: qty 2

## 2018-06-30 MED ORDER — ACETAMINOPHEN 325 MG PO TABS: 650.0000 mg | ORAL_TABLET | Freq: Four times a day (QID) | ORAL | Status: DC | PRN

## 2018-06-30 MED ORDER — LOSARTAN POTASSIUM 50 MG PO TABS
50.0000 mg | ORAL_TABLET | Freq: Every day | ORAL | Status: DC
  Filled 2018-06-30: qty 1

## 2018-06-30 MED ORDER — MAGIC MOUTHWASH: 5.0000 mL | Freq: Four times a day (QID) | ORAL | Status: DC | PRN

## 2018-06-30 MED ORDER — ONDANSETRON HCL 4 MG PO TABS: 4.0000 mg | ORAL_TABLET | Freq: Four times a day (QID) | ORAL | Status: DC | PRN

## 2018-06-30 MED ORDER — PANTOPRAZOLE SODIUM 40 MG PO TBEC
40.0000 mg | DELAYED_RELEASE_TABLET | Freq: Every day | ORAL | Status: DC
  Administered 2018-06-30 – 2018-07-02 (×3): 40 mg via ORAL
  Filled 2018-06-30 (×3): qty 1

## 2018-06-30 MED ORDER — SODIUM CHLORIDE 0.9 % IV SOLN: 250.0000 mL | INTRAVENOUS | Status: DC | PRN

## 2018-06-30 MED ORDER — FLUTICASONE PROPIONATE 50 MCG/ACT NA SUSP
2.0000 | Freq: Every day | NASAL | Status: DC | PRN
  Filled 2018-06-30: qty 16

## 2018-06-30 MED ORDER — INSULIN ASPART 100 UNIT/ML ~~LOC~~ SOLN
6.0000 [IU] | Freq: Three times a day (TID) | SUBCUTANEOUS | Status: DC
  Administered 2018-06-30 – 2018-07-02 (×6): 6 [IU] via SUBCUTANEOUS
  Filled 2018-06-30 (×6): qty 1

## 2018-06-30 MED ORDER — SODIUM CHLORIDE 0.9 % IV SOLN
Freq: Once | INTRAVENOUS | Status: AC
  Administered 2018-06-30: 10:00:00 via INTRAVENOUS

## 2018-06-30 MED ORDER — METHYLPREDNISOLONE SODIUM SUCC 125 MG IJ SOLR
125.0000 mg | Freq: Once | INTRAMUSCULAR | Status: AC
  Administered 2018-06-30: 125 mg via INTRAVENOUS
  Filled 2018-06-30: qty 2

## 2018-06-30 MED ORDER — DICLOFENAC SODIUM 25 MG PO TBEC
25.0000 mg | DELAYED_RELEASE_TABLET | Freq: Two times a day (BID) | ORAL | Status: DC
  Filled 2018-06-30 (×2): qty 1

## 2018-06-30 MED ORDER — INFLUENZA VAC SPLIT QUAD 0.5 ML IM SUSY: 0.5000 mL | PREFILLED_SYRINGE | INTRAMUSCULAR | Status: DC

## 2018-06-30 MED ORDER — SODIUM CHLORIDE 0.9% FLUSH
3.0000 mL | Freq: Two times a day (BID) | INTRAVENOUS | Status: DC
  Administered 2018-06-30 – 2018-07-02 (×4): 3 mL via INTRAVENOUS

## 2018-06-30 MED ORDER — LEVOTHYROXINE SODIUM 50 MCG PO TABS
50.0000 ug | ORAL_TABLET | Freq: Every day | ORAL | Status: DC
  Administered 2018-07-01 – 2018-07-02 (×2): 50 ug via ORAL
  Filled 2018-06-30 (×2): qty 1

## 2018-06-30 MED ORDER — ACETAMINOPHEN 650 MG RE SUPP: 650.0000 mg | Freq: Four times a day (QID) | RECTAL | Status: DC | PRN

## 2018-06-30 MED ORDER — ORAL CARE MOUTH RINSE
15.0000 mL | Freq: Two times a day (BID) | OROMUCOSAL | Status: DC
  Administered 2018-06-30 – 2018-07-01 (×3): 15 mL via OROMUCOSAL

## 2018-06-30 MED ORDER — HYDROCOD POLST-CPM POLST ER 10-8 MG/5ML PO SUER
5.0000 mL | Freq: Once | ORAL | Status: AC
  Administered 2018-06-30: 5 mL via ORAL
  Filled 2018-06-30: qty 5

## 2018-06-30 MED ORDER — LACTULOSE 10 GM/15ML PO SOLN
20.0000 g | Freq: Every day | ORAL | Status: DC
  Administered 2018-06-30 – 2018-07-02 (×3): 20 g via ORAL
  Filled 2018-06-30 (×3): qty 30

## 2018-06-30 MED ORDER — ROSUVASTATIN CALCIUM 10 MG PO TABS
10.0000 mg | ORAL_TABLET | Freq: Every evening | ORAL | Status: DC
  Administered 2018-06-30 – 2018-07-01 (×2): 10 mg via ORAL
  Filled 2018-06-30 (×2): qty 1

## 2018-06-30 MED ORDER — INSULIN ASPART 100 UNIT/ML ~~LOC~~ SOLN
0.0000 [IU] | Freq: Three times a day (TID) | SUBCUTANEOUS | Status: DC
  Administered 2018-06-30: 20 [IU] via SUBCUTANEOUS
  Administered 2018-06-30: 17:00:00 15 [IU] via SUBCUTANEOUS
  Administered 2018-07-01 (×2): 7 [IU] via SUBCUTANEOUS
  Administered 2018-07-01: 11 [IU] via SUBCUTANEOUS
  Filled 2018-06-30 (×5): qty 1

## 2018-06-30 MED ORDER — MORPHINE SULFATE (PF) 2 MG/ML IV SOLN
2.0000 mg | INTRAVENOUS | Status: DC | PRN
  Administered 2018-06-30 (×3): 2 mg via INTRAVENOUS
  Filled 2018-06-30 (×3): qty 1

## 2018-06-30 MED ORDER — SPIRONOLACTONE 25 MG PO TABS
100.0000 mg | ORAL_TABLET | Freq: Every day | ORAL | Status: DC
  Administered 2018-06-30 – 2018-07-02 (×3): 100 mg via ORAL
  Filled 2018-06-30 (×3): qty 4

## 2018-06-30 MED ORDER — IPRATROPIUM-ALBUTEROL 0.5-2.5 (3) MG/3ML IN SOLN
3.0000 mL | Freq: Once | RESPIRATORY_TRACT | Status: AC
  Administered 2018-06-30: 3 mL via RESPIRATORY_TRACT
  Filled 2018-06-30: qty 3

## 2018-06-30 MED ORDER — LEVOFLOXACIN IN D5W 750 MG/150ML IV SOLN
750.0000 mg | Freq: Once | INTRAVENOUS | Status: AC
  Administered 2018-06-30: 750 mg via INTRAVENOUS
  Filled 2018-06-30: qty 150

## 2018-06-30 NOTE — Progress Notes (Signed)
AD education requested. AD education offered. Amber Odonnell plans to complete and have notarized tomorrow (notary  will be on floor tomorrow).

## 2018-06-30 NOTE — ED Notes (Signed)
Patient oxygen dropping to 88% on room air. Placed on 2L [place]. Oxygen at 96%. MD aware.

## 2018-06-30 NOTE — ED Provider Notes (Signed)
[facility] Emergency Department Provider Note       Time seen: ----------------------------------------- 9:14 AM on 06/30/2018 -----------------------------------------   I have reviewed the triage vital signs and the nursing notes.  HISTORY   Chief Complaint Chest Pain    HPI Amber Odonnell is a 47 y.o. female with a history of arthritis, cirrhosis, diabetes, anxiety, GERD, hyperlipidemia, hypertension, sleep apnea, thrombocytopenia who presents to the ED for left-sided chest pain shortness of breath.  Patient states the pain is sharp and got worse during the late last night.  Patient states she was treated 2 days ago for an upper respiratory infection she feels like she has a hard time catching her breath.  She denies fevers, chills or other complaints.  Past Medical History:  Diagnosis Date  . Arthritis    of knee  . Asthma   . Chronic gastritis   . Cirrhosis of liver without ascites ([place])   . Diabetes mellitus without complication ([place])    type 2  . Dyspnea   . Generalized anxiety disorder   . GERD (gastroesophageal reflux disease)   . Hyperlipidemia   . Hypertension   . Hypothyroidism   . Irritable bowel syndrome (IBS)    w/ constipation  . Obesity (BMI 30-39.9)   . Portal hypertension ([place])   . Sleep apnea    CPAP  . Splenomegaly   . Thrombocytopenia [facility])     Patient Active Problem List   Diagnosis Date Noted  . Iron deficiency anemia secondary to blood loss (chronic)   . Secondary esophageal varices without bleeding ([place])   . Renal insufficiency 07/14/2017  . Anemia 07/14/2017  . Enlarged pituitary gland ([place]) 07/10/2017  . Headache syndrome 07/10/2017  . Secondary esophageal varices with bleeding ([place])   . Vaginal flatus 03/13/2017  . Eye discharge 03/13/2017  . Esophageal varices determined by endoscopy ([place]) 03/11/2017  . Abnormal feces   . Polyp of sigmoid colon   . Gastritis without bleeding   . Environmental and  seasonal allergies 01/21/2017  . Type 2 diabetes mellitus without complication, with long-term current use of insulin ([place]) 01/21/2017  . Hypothyroidism due to acquired atrophy of thyroid 01/21/2017  . Hyperlipidemia associated with type 2 diabetes mellitus ([place]) 01/21/2017  . Irritable bowel syndrome with constipation 01/21/2017  . Obstructive sleep apnea syndrome 01/21/2017  . Thrombocytopenia ([place])   . Splenomegaly   . Portal hypertension ([place])   . Obesity (BMI 30-39.9)   . Essential hypertension   . Hyperlipidemia   . Generalized anxiety disorder   . Hepatic cirrhosis ([place])   . Chronic gastritis   . Arthritis     Past Surgical History:  Procedure Laterality Date  . COLONOSCOPY  10/08/2017   No polyps or mass [facility])  . COLONOSCOPY WITH PROPOFOL N/A 07/24/2017   Procedure: COLONOSCOPY WITH PROPOFOL;  Surgeon: [name], MD;  Location: [place];  Service: Endoscopy;  Laterality: N/A;  . ESOPHAGOGASTRODUODENOSCOPY (EGD) WITH PROPOFOL N/A 02/26/2017   Procedure: ESOPHAGOGASTRODUODENOSCOPY (EGD) WITH Banding;  Surgeon: [name], MD;  Location: [place];  Service: Endoscopy;  Laterality: N/A;  . ESOPHAGOGASTRODUODENOSCOPY (EGD) WITH PROPOFOL N/A 04/10/2017   Procedure: ESOPHAGOGASTRODUODENOSCOPY (EGD) WITH PROPOFOL;  Surgeon: [name], MD;  Location: [place];  Service: Endoscopy;  Laterality: N/A;  Diabetic  sleep apnea  with banding  . ESOPHAGOGASTRODUODENOSCOPY (EGD) WITH PROPOFOL N/A 07/24/2017   Procedure: ESOPHAGOGASTRODUODENOSCOPY (EGD) WITH PROPOFOL;  Surgeon: [name], MD;  Location: [place];  Service:  Endoscopy;  Laterality: N/A;  Diabetic - insulin  sleep apnea  . TONSILLECTOMY    . TOTAL VAGINAL HYSTERECTOMY     endometriosis    Allergies Hydrocodone  Social History Social History   Tobacco Use  . Smoking status: Never Smoker  . Smokeless tobacco: Never Used  Substance Use Topics  . Alcohol use: No  .  Drug use: No   Review of Systems Constitutional: Negative for fever. Cardiovascular: Positive for chest pain Respiratory: Positive shortness of breath and cough Gastrointestinal: Negative for abdominal pain, vomiting and diarrhea. Musculoskeletal: Negative for back pain. Skin: Negative for rash. Neurological: Negative for headaches, positive for weakness  All systems negative/normal/unremarkable except as stated in the HPI  ____________________________________________   PHYSICAL EXAM:  VITAL SIGNS: ED Triage Vitals  Enc Vitals Group     BP 06/30/18 0855 (!) 135/56     Pulse Rate 06/30/18 0855 90     Resp 06/30/18 0855 18     Temp 06/30/18 0855 99.5 F (37.5 C)     Temp Source 06/30/18 0855 Oral     SpO2 06/30/18 0855 96 %     Weight 06/30/18 0859 160 lb (72.6 kg)     Height 06/30/18 0859 5\' 3"  (1.6 m)     Head Circumference --      Peak Flow --      Pain Score 06/30/18 0858 10     Pain Loc --      Pain Edu? --      Excl. in [place]? --    Constitutional: Alert and oriented.  Mild distress Eyes: Conjunctivae are normal. Normal extraocular movements. ENT   Head: Normocephalic and atraumatic.   Nose: No congestion/rhinnorhea.   Mouth/Throat: Mucous membranes are moist.   Neck: No stridor. Cardiovascular: Normal rate, regular rhythm. No murmurs, rubs, or gallops. Respiratory: Splinting is noted with shallow respirations but clear breath sounds. Gastrointestinal: Soft and nontender. Normal bowel sounds Musculoskeletal: Nontender with normal range of motion in extremities. No lower extremity tenderness nor edema. Neurologic:  Normal speech and language. No gross focal neurologic deficits are appreciated.  Skin:  Skin is warm, dry and intact. No rash noted. Psychiatric: Mood and affect are normal. Speech and behavior are normal.  ____________________________________________  EKG: Interpreted by me.  Sinus rhythm rate 87 bpm, normal PR interval, normal QRS, normal  QT  ____________________________________________  ED COURSE:  As part of my medical decision making, I reviewed the following data within the [place] History obtained from family if available, nursing notes, old chart and ekg, as well as notes from prior ED visits. Patient presented for chest pain and upper respiratory symptoms, we will assess with labs and imaging as indicated at this time.   Procedures ____________________________________________   LABS (pertinent positives/negatives)  Labs Reviewed  BASIC METABOLIC PANEL - Abnormal; Notable for the following components:      Result Value   Glucose, Bld 447 (*)    Calcium 8.8 (*)    All other components within normal limits  CBC - Abnormal; Notable for the following components:   Hemoglobin 11.0 (*)    HCT 32.9 (*)    RDW 18.9 (*)    Platelets 67 (*)    All other components within normal limits  TROPONIN I  FIBRIN DERIVATIVES D-DIMER ([facility] ONLY)    RADIOLOGY Images were viewed by me  Chest x-ray IMPRESSION: Hypoinflation of the lungs. Mild left basilar subsegmental atelectasis or pneumonia is noted. Followup PA and  lateral chest X-ray is recommended in 3-4 weeks following trial of antibiotic therapy to ensure resolution and exclude underlying malignancy. ____________________________________________  DIFFERENTIAL DIAGNOSIS   Pneumonia, URI, bronchospasm, PE unlikely, allergies  FINAL ASSESSMENT AND PLAN  Pneumonia, hyperglycemia   Plan: The patient had presented for chest pain associated with recent respiratory complaints. Patient's labs did reveal significant hyperglycemia but no other acute process.  D-dimer was negative. Patient's imaging did reveal what looks like a left basilar pneumonia that is small.  She was given IV Levaquin as well as a DuoNeb, steroids and Tussionex here.  We gave her fluids and subcu insulin for her sugar.  She reports being out of 1 of her diabetes  medications.   Laurence Aly, MD   Note: This note was generated in part or whole with voice recognition software. Voice recognition is usually quite accurate but there are transcription errors that can and very often do occur. I apologize for any typographical errors that were not detected and corrected.     Earleen Newport, MD 06/30/18 1031

## 2018-06-30 NOTE — ED Notes (Signed)
First Nurse Note: Patient to ED via [place] from [facility] with complaint of chest pain and SHOB.  Alert and oriented.  Color good, skin warm and dry.  Speaking in full sentences.  Hx of URI with wheezing starting this AM.

## 2018-06-30 NOTE — ED Notes (Signed)
Put PT in ROOM 19 notified RN Mickel Baas.

## 2018-06-30 NOTE — Progress Notes (Signed)
    06/30/18 1415  Clinical Encounter Type  Visited With Patient  Visit Type Initial (order for advanced directive)  Referral From Nurse  Consult/Referral To Chaplain   Chaplain responded to order for advanced directive.  Patient reported that she had just received some pain medicine and would like to rest.  Chaplain left document; chaplain to follow up later today regarding advanced directive education.

## 2018-06-30 NOTE — Progress Notes (Signed)
Advanced care plan.  Purpose of the Encounter: CODE STATUS  Parties in Attendance: Patient  Patient's Decision Capacity: Good  Subjective/Patient's story: Presented to emergency room for shortness of breath and cough   Objective/Medical story Has pneumonia and failed outpatient therapy Needs IV antibiotics   Goals of care determination:  Advance care directives and goals of care discussed with the patient Patient has history of cirrhosis of liver and uncontrolled diabetes mellitus treatment plan discussed Patient wants everything done which includes CPR, intubation and ventilator if the need arises   CODE STATUS: Full code   Time spent discussing advanced care planning: 16 minutes

## 2018-06-30 NOTE — ED Triage Notes (Addendum)
Patient presents to the ED with left sided chest pain and shortness of breath.  Patient states pain is sharp and got worse during the night last night.  Patient is being treated for an upper respiratory infection.  Patient states, "I feel like I can't catch my breath."  Patient appears uncomfortable in triage.  Tachypnic with somewhat shallow breaths. Patient speaking in short sentences.

## 2018-06-30 NOTE — H&P (Signed)
[facility] [place] NAME: Amber Odonnell    MR#:  [id number]  DATE OF BIRTH:  Jun 23, 1971  DATE OF ADMISSION:  06/30/2018  PRIMARY CARE PHYSICIAN: Glean Hess, MD   REQUESTING/REFERRING PHYSICIAN:   CHIEF COMPLAINT:   Chief Complaint  Patient presents with  . Chest Pain    HISTORY OF PRESENT ILLNESS: Amber Odonnell  is a 47 y.o. female with a known history of dose of liver, diabetes mellitus type 2 insulin-dependent, chronic gastritis, anxiety disorder, GERD, hyperlipidemia, hypertension, portal hypertension presented to the emergency room for shortness of breath.  Patient also has cough and chills.  Patient recently visited may be an urgent care clinic and was prescribed oral Zithromax antibiotic for bronchitis .  She continued to have shortness of breath and cough.  Her oxygen saturation was around 86% in the emergency room she was put on oxygen via nasal cannula at 2 L.  Patient was started on IV Levaquin antibiotic for pneumonia which was detected on chest x-ray.  Sugars have been elevated.  PAST MEDICAL HISTORY:   Past Medical History:  Diagnosis Date  . Arthritis    of knee  . Asthma   . Chronic gastritis   . Cirrhosis of liver without ascites ([place])   . Diabetes mellitus without complication ([place])    type 2  . Dyspnea   . Generalized anxiety disorder   . GERD (gastroesophageal reflux disease)   . Hyperlipidemia   . Hypertension   . Hypothyroidism   . Irritable bowel syndrome (IBS)    w/ constipation  . Obesity (BMI 30-39.9)   . Portal hypertension ([place])   . Sleep apnea    CPAP  . Splenomegaly   . Thrombocytopenia ([place])     PAST SURGICAL HISTORY:  Past Surgical History:  Procedure Laterality Date  . COLONOSCOPY  10/08/2017   No polyps or mass [facility])  . COLONOSCOPY WITH PROPOFOL N/A 07/24/2017   Procedure: COLONOSCOPY WITH PROPOFOL;  Surgeon: [name], MD;  Location: [place];  Service:  Endoscopy;  Laterality: N/A;  . ESOPHAGOGASTRODUODENOSCOPY (EGD) WITH PROPOFOL N/A 02/26/2017   Procedure: ESOPHAGOGASTRODUODENOSCOPY (EGD) WITH Banding;  Surgeon: [name], MD;  Location: [place];  Service: Endoscopy;  Laterality: N/A;  . ESOPHAGOGASTRODUODENOSCOPY (EGD) WITH PROPOFOL N/A 04/10/2017   Procedure: ESOPHAGOGASTRODUODENOSCOPY (EGD) WITH PROPOFOL;  Surgeon: [name], MD;  Location: [place];  Service: Endoscopy;  Laterality: N/A;  Diabetic  sleep apnea  with banding  . ESOPHAGOGASTRODUODENOSCOPY (EGD) WITH PROPOFOL N/A 07/24/2017   Procedure: ESOPHAGOGASTRODUODENOSCOPY (EGD) WITH PROPOFOL;  Surgeon: [name], MD;  Location: [place];  Service: Endoscopy;  Laterality: N/A;  Diabetic - insulin  sleep apnea  . TONSILLECTOMY    . TOTAL VAGINAL HYSTERECTOMY     endometriosis    SOCIAL HISTORY:  Social History   Tobacco Use  . Smoking status: Never Smoker  . Smokeless tobacco: Never Used  Substance Use Topics  . Alcohol use: No    FAMILY HISTORY:  Family History  Problem Relation Age of Onset  . Breast cancer Mother   . Diabetes Mother   . Diabetes Father   . Hypertension Father   . Cancer Paternal Grandmother     DRUG ALLERGIES:  Allergies  Allergen Reactions  . Hydrocodone Other (See Comments)    thrush    REVIEW OF SYSTEMS:   CONSTITUTIONAL: No fever, fatigue or weakness.  EYES: No blurred or double vision.  EARS, NOSE, AND THROAT: No tinnitus or ear pain.  RESPIRATORY: Has cough, shortness of breath,  No wheezing  No hemoptysis.  CARDIOVASCULAR: No chest pain, orthopnea, edema.  GASTROINTESTINAL: No nausea, vomiting, diarrhea or abdominal pain.  GENITOURINARY: No dysuria, hematuria.  ENDOCRINE: Has polyuria, nocturia,  HEMATOLOGY: No anemia, easy bruising or bleeding SKIN: No rash or lesion. MUSCULOSKELETAL: No joint pain or arthritis.   NEUROLOGIC: No tingling, numbness, weakness.  PSYCHIATRY: No anxiety  or depression.   MEDICATIONS AT HOME:  Prior to Admission medications   Medication Sig Start Date End Date Taking? Authorizing Provider  B Complex Vitamins (B-COMPLEX/B-12 SL) Place 1 tablet under the tongue daily.    Yes [provider]  benzonatate (TESSALON) 200 MG capsule Take 1 capsule (200 mg total) by mouth 3 (three) times daily as needed for cough. 06/28/18  Yes Wynona Luna, MD  fluticasone [facility]) 50 MCG/ACT nasal spray Place 2 sprays into both nostrils daily as needed for allergies.    Yes [provider]  insulin aspart (NOVOLOG FLEXPEN) 100 UNIT/ML FlexPen Inject 60 Units into the skin 3 (three) times daily with meals. 06/09/17  Yes Glean Hess, MD  Insulin Degludec (TRESIBA FLEXTOUCH [place]) Inject 92 Units into the skin at bedtime.    Yes [provider]  levothyroxine (SYNTHROID, LEVOTHROID) 50 MCG tablet TAKE 1 TABLET(50 MCG) BY MOUTH DAILY BEFORE BREAKFAST Patient taking differently: Take 50 mcg by mouth daily before breakfast.  06/22/18  Yes Glean Hess, MD  magic mouthwash SOLN Take 5 mLs by mouth 4 (four) times daily. 07/31/17  Yes Glean Hess, MD  MEGARED OMEGA-3 KRILL OIL PO Take 1 tablet by mouth at bedtime.    Yes [provider]  Multiple Vitamins-Minerals (ALIVE ONCE DAILY WOMENS 50+ PO) Take 1 tablet by mouth daily.    Yes [provider]  nadolol (CORGARD) 20 MG tablet Take 20 mg by mouth daily.  12/14/17  Yes [provider]  pantoprazole (PROTONIX) 40 MG tablet TAKE 1 TABLET BY MOUTH EVERY DAY 11/24/17  Yes [name], MD  predniSONE (DELTASONE) 50 MG tablet Take 1 tablet (50 mg total) by mouth daily. 06/28/18  Yes Wynona Luna, MD  furosemide (LASIX) 40 MG tablet Take 1 tablet (40 mg total) by mouth daily. 04/10/17 06/28/18  [name], MD  losartan (COZAAR) 50 MG tablet Take 1 tablet (50 mg total) by mouth daily. May take 1/2 twice a day Patient not taking: Reported on 06/30/2018  07/14/17   Glean Hess, MD  montelukast (SINGULAIR) 10 MG tablet TAKE 1 TABLET(10 MG) BY MOUTH AT BEDTIME Patient not taking: Reported on 06/30/2018 11/07/17   Glean Hess, MD  sertraline (ZOLOFT) 50 MG tablet TAKE 1 AND 1/2 TABLETS(75 MG) BY MOUTH DAILY Patient not taking: Reported on 06/30/2018 04/14/18   Glean Hess, MD      PHYSICAL EXAMINATION:   VITAL SIGNS: Blood pressure 133/75, pulse 90, temperature 99.5 F (37.5 C), temperature source Oral, resp. rate (!) 33, height 5\' 3"  (1.6 m), weight 72.6 kg, SpO2 96 %.  GENERAL:  47 y.o.-year-old patient lying in the bed with no acute distress.  EYES: Pupils equal, round, reactive to light and accommodation. No scleral icterus. Extraocular muscles intact.  HEENT: Head atraumatic, normocephalic. Oropharynx and nasopharynx clear.  NECK:  Supple, no jugular venous distention. No thyroid enlargement, no tenderness.  LUNGS: Decreased breath breath sounds bilaterally, no wheezing, rales heard in the left lung. No use  of accessory muscles of respiration.  CARDIOVASCULAR: S1, S2 normal. No murmurs, rubs, or gallops.  ABDOMEN: Soft, nontender, nondistended. Bowel sounds present. No organomegaly or mass.  EXTREMITIES: No pedal edema, cyanosis, or clubbing.  NEUROLOGIC: Cranial nerves II through XII are intact. Muscle strength 5/5 in all extremities. Sensation intact. Gait not checked.  PSYCHIATRIC: The patient is alert and oriented x 3.  SKIN: No obvious rash, lesion, or ulcer.   LABORATORY PANEL:   CBC Recent Labs  Lab 06/30/18 0918  WBC 6.4  HGB 11.0*  HCT 32.9*  PLT 67*  MCV 83.6  MCH 28.0  MCHC 33.4  RDW 18.9*   ------------------------------------------------------------------------------------------------------------------  Chemistries  Recent Labs  Lab 06/30/18 0918  NA 135  K 4.0  CL 102  CO2 23  GLUCOSE 447*  BUN 8  CREATININE 0.62  CALCIUM 8.8*    ------------------------------------------------------------------------------------------------------------------ estimated creatinine clearance is 81.2 mL/min (by C-G formula based on SCr of 0.62 mg/dL). ------------------------------------------------------------------------------------------------------------------ No results for input(s): TSH, T4TOTAL, T3FREE, THYROIDAB in the last 72 hours.  Invalid input(s): FREET3   Coagulation profile No results for input(s): INR, PROTIME in the last 168 hours. ------------------------------------------------------------------------------------------------------------------- No results for input(s): DDIMER in the last 72 hours. -------------------------------------------------------------------------------------------------------------------  Cardiac Enzymes Recent Labs  Lab 06/30/18 0918  TROPONINI <0.03   ------------------------------------------------------------------------------------------------------------------ Invalid input(s): POCBNP  ---------------------------------------------------------------------------------------------------------------  Urinalysis No results found for: COLORURINE, APPEARANCEUR, LABSPEC, PHURINE, GLUCOSEU, HGBUR, BILIRUBINUR, KETONESUR, PROTEINUR, UROBILINOGEN, NITRITE, LEUKOCYTESUR   RADIOLOGY: Dg Chest 2 View  Result Date: 06/30/2018 CLINICAL DATA:  Chest pain, shortness of breath. EXAM: CHEST - 2 VIEW COMPARISON:  None. FINDINGS: The heart size and mediastinal contours are within normal limits. No pneumothorax or pleural effusion is noted. Hypoinflation of the lungs is noted. Right lung is clear. Mild left basilar atelectasis or infiltrate is noted. The visualized skeletal structures are unremarkable. IMPRESSION: Hypoinflation of the lungs. Mild left basilar subsegmental atelectasis or pneumonia is noted. Followup PA and lateral chest X-ray is recommended in 3-4 weeks following trial of antibiotic  therapy to ensure resolution and exclude underlying malignancy. Electronically Signed   By: Marijo Conception, M.D.   On: 06/30/2018 10:00    EKG: Orders placed or performed during the hospital encounter of 06/30/18  . EKG 12-Lead  . EKG 12-Lead  . ED EKG within 10 minutes  . ED EKG within 10 minutes    IMPRESSION AND PLAN: 47 year old female patient with history of cirrhosis of liver, portal hypertension, hypertension, diabetes mellitus type 2 insulin-dependent, hyperlipidemia presented to the emergency room with cough and shortness of breath and chills  -Community-acquired pneumonia Start patient on IV Levaquin antibiotic Follow-up cultures Antitussive medication  -Hypoxia secondary to pneumonia Oxygen via nasal cannula 2 L  -Cirrhosis of liver Supportive care Continue Aldactone for diuresis Continue oral lactulose  -Thrombocytopenia secondary to liver disease Monitor platelet counts  -Uncontrolled diabetes mellitus Lantus insulin 15 units subcu twice daily along with aggressive sliding scale coverage with insulin Diabetic diet With elevated blood sugars could be secondary to infection and pneumonia  -DVT prophylaxis with sequential compression device to lower extremities  All the records are reviewed and case discussed with ED provider. Management plans discussed with the patient, family and they are in agreement.  CODE STATUS:Full code    TOTAL TIME TAKING CARE OF THIS PATIENT: 51 minutes.    Saundra Shelling M.D on 06/30/2018 at 10:57 AM  Between 7am to 6pm - Pager - [phone]  After 6pm go to [email or web address] - password [email or web address]  [facility] [place] [facility]  Office  [phone]  CC: Primary care physician; Glean Hess, MD

## 2018-07-01 LAB — GLUCOSE, CAPILLARY
Glucose-Capillary: 214 mg/dL — ABNORMAL HIGH (ref 70–99)
Glucose-Capillary: 295 mg/dL — ABNORMAL HIGH (ref 70–99)
Glucose-Capillary: 243 mg/dL — ABNORMAL HIGH (ref 70–99)
Glucose-Capillary: 282 mg/dL — ABNORMAL HIGH (ref 70–99)

## 2018-07-01 LAB — BASIC METABOLIC PANEL
Anion gap: 5 (ref 5–15)
BUN: 10 mg/dL (ref 6–20)
CHLORIDE: 109 mmol/L (ref 98–111)
CO2: 25 mmol/L (ref 22–32)
Calcium: 8.2 mg/dL — ABNORMAL LOW (ref 8.9–10.3)
Creatinine, Ser: 0.43 mg/dL — ABNORMAL LOW (ref 0.44–1.00)
Glucose, Bld: 281 mg/dL — ABNORMAL HIGH (ref 70–99)
POTASSIUM: 3.9 mmol/L (ref 3.5–5.1)
SODIUM: 139 mmol/L (ref 135–145)

## 2018-07-01 LAB — CBC
HEMATOCRIT: 27.6 % — AB (ref 35.0–47.0)
Hemoglobin: 9 g/dL — ABNORMAL LOW (ref 12.0–16.0)
MCH: 27.4 pg (ref 26.0–34.0)
MCHC: 32.7 g/dL (ref 32.0–36.0)
MCV: 83.7 fL (ref 80.0–100.0)
PLATELETS: 39 10*3/uL — AB (ref 150–440)
RBC: 3.29 MIL/uL — AB (ref 3.80–5.20)
RDW: 18.7 % — ABNORMAL HIGH (ref 11.5–14.5)
WBC: 3 10*3/uL — AB (ref 3.6–11.0)

## 2018-07-01 MED ORDER — GUAIFENESIN-CODEINE 100-10 MG/5ML PO SOLN: 5.0000 mL | Freq: Four times a day (QID) | ORAL | Status: DC | PRN

## 2018-07-01 MED ORDER — TRAMADOL HCL 50 MG PO TABS: 50.0000 mg | ORAL_TABLET | Freq: Four times a day (QID) | ORAL | Status: DC | PRN

## 2018-07-01 MED ORDER — INSULIN GLARGINE 100 UNIT/ML ~~LOC~~ SOLN
20.0000 [IU] | Freq: Two times a day (BID) | SUBCUTANEOUS | Status: DC
  Administered 2018-07-01 – 2018-07-02 (×2): 20 [IU] via SUBCUTANEOUS
  Filled 2018-07-01 (×3): qty 0.2

## 2018-07-01 MED ORDER — CYCLOBENZAPRINE HCL 10 MG PO TABS
5.0000 mg | ORAL_TABLET | Freq: Two times a day (BID) | ORAL | Status: DC
  Administered 2018-07-01 – 2018-07-02 (×3): 5 mg via ORAL
  Filled 2018-07-01 (×3): qty 1

## 2018-07-01 MED ORDER — BENZONATATE 100 MG PO CAPS
200.0000 mg | ORAL_CAPSULE | Freq: Three times a day (TID) | ORAL | Status: DC
  Administered 2018-07-01 – 2018-07-02 (×3): 200 mg via ORAL
  Filled 2018-07-01 (×3): qty 2

## 2018-07-01 NOTE — Progress Notes (Signed)
[facility] at [place] NAME: Amber Odonnell    MR#:  [id number]  DATE OF BIRTH:  September 03, 1971  SUBJECTIVE:  CHIEF COMPLAINT:   Chief Complaint  Patient presents with  . Chest Pain   -Left-sided pain on coughing. -Breathing is improving  REVIEW OF SYSTEMS:  Review of Systems  Constitutional: Negative for chills, fever and malaise/fatigue.  HENT: Negative for congestion, ear discharge, hearing loss and nosebleeds.   Eyes: Negative for blurred vision and double vision.  Respiratory: Positive for cough and shortness of breath. Negative for wheezing.   Cardiovascular: Negative for chest pain and palpitations.  Gastrointestinal: Negative for abdominal pain, constipation, diarrhea, nausea and vomiting.  Genitourinary: Negative for dysuria.  Musculoskeletal: Negative for myalgias.  Neurological: Negative for dizziness, focal weakness, seizures, weakness and headaches.  Psychiatric/Behavioral: Negative for depression.    DRUG ALLERGIES:   Allergies  Allergen Reactions  . Hydrocodone Other (See Comments)    thrush    VITALS:  Blood pressure (!) 100/58, pulse (!) 56, temperature 97.9 F (36.6 C), temperature source Oral, resp. rate 18, height 5\' 3"  (1.6 m), weight 72.6 kg, SpO2 99 %.  PHYSICAL EXAMINATION:  Physical Exam  GENERAL:  47 y.o.-year-old patient lying in the bed with no acute distress.  EYES: Pupils equal, round, reactive to light and accommodation. No scleral icterus. Extraocular muscles intact.  HEENT: Head atraumatic, normocephalic. Oropharynx and nasopharynx clear.  NECK:  Supple, no jugular venous distention. No thyroid enlargement, no tenderness.  LUNGS: Normal breath sounds bilaterally, except decreased left basilar breath sounds.  No wheezing, rales,rhonchi or crepitation. No use of accessory muscles of respiration.  CARDIOVASCULAR: S1, S2 normal. No murmurs, rubs, or gallops.  ABDOMEN: Soft, nontender, nondistended.  Bowel sounds present. No organomegaly or mass.  EXTREMITIES: No pedal edema, cyanosis, or clubbing.  NEUROLOGIC: Cranial nerves II through XII are intact. Muscle strength 5/5 in all extremities. Sensation intact. Gait not checked.  PSYCHIATRIC: The patient is alert and oriented x 3.  SKIN: No obvious rash, lesion, or ulcer.    LABORATORY PANEL:   CBC Recent Labs  Lab 07/01/18 0344  WBC 3.0*  HGB 9.0*  HCT 27.6*  PLT 39*   ------------------------------------------------------------------------------------------------------------------  Chemistries  Recent Labs  Lab 07/01/18 0344  NA 139  K 3.9  CL 109  CO2 25  GLUCOSE 281*  BUN 10  CREATININE 0.43*  CALCIUM 8.2*   ------------------------------------------------------------------------------------------------------------------  Cardiac Enzymes Recent Labs  Lab 06/30/18 0918  TROPONINI <0.03   ------------------------------------------------------------------------------------------------------------------  RADIOLOGY:  Dg Chest 2 View  Result Date: 06/30/2018 CLINICAL DATA:  Chest pain, shortness of breath. EXAM: CHEST - 2 VIEW COMPARISON:  None. FINDINGS: The heart size and mediastinal contours are within normal limits. No pneumothorax or pleural effusion is noted. Hypoinflation of the lungs is noted. Right lung is clear. Mild left basilar atelectasis or infiltrate is noted. The visualized skeletal structures are unremarkable. IMPRESSION: Hypoinflation of the lungs. Mild left basilar subsegmental atelectasis or pneumonia is noted. Followup PA and lateral chest X-ray is recommended in 3-4 weeks following trial of antibiotic therapy to ensure resolution and exclude underlying malignancy. Electronically Signed   By: Marijo Conception, M.D.   On: 06/30/2018 10:00    EKG:   Orders placed or performed during the hospital encounter of 06/30/18  . EKG 12-Lead  . EKG 12-Lead  . ED EKG within 10 minutes  . ED EKG within 10  minutes    ASSESSMENT AND  PLAN:   47 year old female with past medical history significant for liver cirrhosis, diabetes mellitus, GERD gastritis, anxiety, hypertension presents to hospital secondary to worsening shortness of breath.   1.  Community-acquired pneumonia-improving -Continue Levaquin for now.  Cultures are negative -Added cough medicine and muscle relaxant for chest pain secondary to cough -She has been weaned off the oxygen this morning  2.  Thrombocytopenia-acute on chronic.  Secondary to liver disease, worsened by her infection -Continue to monitor at this time -No indication for transfusion.  No active bleeding.  3.  Diabetes mellitus-on Lantus and sliding scale insulin. -Lantus dose being adjusted  4.  Hypothyroidism-continue Synthroid  5.  Liver cirrhosis-continue outpatient follow-up.  Patient on labetalol, Aldactone and lactulose daily.  6.  DVT prophylaxis-teds and SCDs only due to thrombocytopenia  Patient is active and ambulatory in the room    All the records are reviewed and case discussed with Care Management/Social Workerr. Management plans discussed with the patient, family and they are in agreement.  CODE STATUS: Full code  TOTAL TIME TAKING CARE OF THIS PATIENT: 37 minutes.   POSSIBLE D/C IN 1-2 DAYS, DEPENDING ON CLINICAL CONDITION.   Gladstone Lighter M.D on 07/01/2018 at 2:29 PM  Between 7am to 6pm - Pager - [phone]  After 6pm go to [email or web address] - password [email or web address] [place] [facility]  Office  [phone]  CC: Primary care physician; Glean Hess, MD

## 2018-07-01 NOTE — Plan of Care (Signed)
  Problem: Education: Goal: Knowledge of General Education information will improve Description Including pain rating scale, medication(s)/side effects and non-pharmacologic comfort measures Outcome: Progressing   Problem: Health Behavior/Discharge Planning: Goal: Ability to manage health-related needs will improve Outcome: Progressing   Problem: Clinical Measurements: Goal: Ability to maintain clinical measurements within normal limits will improve Outcome: Progressing Goal: Respiratory complications will improve Outcome: Progressing   Problem: Activity: Goal: Risk for activity intolerance will decrease Outcome: Progressing   Problem: Coping: Goal: Ability to adjust to condition or change in health will improve Outcome: Progressing   Problem: Fluid Volume: Goal: Ability to maintain a balanced intake and output will improve Outcome: Progressing   Problem: Metabolic: Goal: Ability to maintain appropriate glucose levels will improve Outcome: Progressing   Problem: Spiritual Needs Goal: Ability to function at adequate level Outcome: Progressing

## 2018-07-01 NOTE — Progress Notes (Addendum)
Inpatient Diabetes Program Recommendations  AACE/ADA: New Consensus Statement on Inpatient Glycemic Control (2019)  Target Ranges:  Prepandial:   less than 140 mg/dL      Peak postprandial:   less than 180 mg/dL (1-2 hours)      Critically ill patients:  140 - 180 mg/dL   Results for Amber Odonnell, Amber Odonnell (MRN [id number]) as of 07/01/2018 08:42  Ref. Range 06/30/2018 11:48 06/30/2018 16:23 06/30/2018 22:01 07/01/2018 07:24  Glucose-Capillary Latest Ref Range: 70 - 99 mg/dL 378 (H) 318 (H) 337 (H) 214 (H)  Results for Amber Odonnell, Amber Odonnell (MRN [id number]) as of 07/01/2018 08:42  Ref. Range 06/30/2018 09:18  Glucose Latest Ref Range: 70 - 99 mg/dL 447 (H)  Results for Amber Odonnell, Amber Odonnell (MRN [id number]) as of 07/01/2018 08:42  Ref. Range 03/05/2018 00:00  Hemoglobin A1C Unknown 13.2   Review of Glycemic Control  Diabetes history: DM2 Outpatient Diabetes medications: Tresiba 92 units QHS, Novolog 60 units TID with meals Current orders for Inpatient glycemic control: Lantus 15 units BID, Novolog 6 units TID with meals, Novolog 0-20 units TID with meals, Novolog 0-5 units QHS  Inpatient Diabetes Program Recommendations:  Insulin-Basal: Please consider increasing Lantus to 20 units BID. Insulin-Meal Coverage: Please consider increasing meal coverage to Novolog 15 units TID with meals. A1C: Please consider ordering an A1C to evaluate glycemic control over the past 2-3 months.  NOTE: Noted consult. Chart reviewed.  Admitted with PNU, hx cirrhosis; given Solumedrol 125 x 1 in ER on 06/30/18. Initial glucose 447.  Patient is followed by Dr. Gabriel Carina (last seen 02/22/18); A1C 13.2% on 03/05/18. Per Solum's note on 02/22/18, patient does not take insulin as instructed. Patient is being educated on carb counting and Dr. Gabriel Carina plans to get patient on OmniPod insulin pump. Will plan to see patient today.  Addendum 07/01/18@13 :15-Spoke with patient about diabetes and home regimen for diabetes control. Patient reports that she is  followed by Dr. Gabriel Carina for diabetes management and currently she takes Tresiba 92 units QHS and Novolog 60 units TID with meals as an outpatient for diabetes control. Patient reports that she is taking insulin consistently. Patient states that she checks her glucose 3 times per day and that it is usually averages in the 300's mg/dl and higher when she is taking steroids. Discussed current insulin orders and explained that she is not ordered as much insulin here as she notes that she takes at home. Also noted that patient received one time Solumedrol yesterday which is contributing to hyperglycemia.  Discussed glucose and A1C goals. Discussed importance of checking CBGs and maintaining good CBG control to prevent long-term and short-term complications. Explained how hyperglycemia leads to damage within blood vessels which lead to the common complications seen with uncontrolled diabetes. Stressed to the patient the importance of improving glycemic control to prevent further complications from uncontrolled diabetes. Discussed impact of nutrition, exercise, stress, sickness, and medications on diabetes control. Patient states that she is in the process of working with educator to learn how to count carbohydrates in hopes of going on an insulin pump soon. Encouraged patient to keep a detailed record of glucose readings and insulin taken so that Dr. Gabriel Carina can use the information to make adjustments to get DM better controlled.  Encouraged patient to follow up with Dr. Gabriel Carina regarding DM control and possible insulin pump in the future. Patient verbalized understanding of information discussed and she states that she has no further questions at this time related to diabetes.  Thanks, Barnie Alderman, RN, MSN,  CDE Diabetes Coordinator Inpatient Diabetes Program [phone] (Team Pager from 8am to 5pm)

## 2018-07-02 LAB — CBC
HCT: 29.4 % — ABNORMAL LOW (ref 35.0–47.0)
HEMOGLOBIN: 9.6 g/dL — AB (ref 12.0–16.0)
MCH: 27.8 pg (ref 26.0–34.0)
MCHC: 32.8 g/dL (ref 32.0–36.0)
MCV: 84.7 fL (ref 80.0–100.0)
PLATELETS: 44 10*3/uL — AB (ref 150–440)
RBC: 3.47 MIL/uL — AB (ref 3.80–5.20)
RDW: 18.7 % — ABNORMAL HIGH (ref 11.5–14.5)
WBC: 2.8 10*3/uL — ABNORMAL LOW (ref 3.6–11.0)

## 2018-07-02 LAB — COMPREHENSIVE METABOLIC PANEL
ALT: 29 U/L (ref 0–44)
AST: 42 U/L — AB (ref 15–41)
Albumin: 2.7 g/dL — ABNORMAL LOW (ref 3.5–5.0)
Alkaline Phosphatase: 157 U/L — ABNORMAL HIGH (ref 38–126)
Anion gap: 6 (ref 5–15)
BUN: 9 mg/dL (ref 6–20)
CHLORIDE: 112 mmol/L — AB (ref 98–111)
CO2: 24 mmol/L (ref 22–32)
CREATININE: 0.41 mg/dL — AB (ref 0.44–1.00)
Calcium: 8 mg/dL — ABNORMAL LOW (ref 8.9–10.3)
GFR calc Af Amer: >60 mL/min (ref >60)
GLUCOSE: 162 mg/dL — AB (ref 70–99)
Potassium: 3.4 mmol/L — ABNORMAL LOW (ref 3.5–5.1)
SODIUM: 142 mmol/L (ref 135–145)
Total Bilirubin: 1.4 mg/dL — ABNORMAL HIGH (ref 0.3–1.2)
Total Protein: 5.9 g/dL — ABNORMAL LOW (ref 6.5–8.1)

## 2018-07-02 LAB — HIV ANTIBODY (ROUTINE TESTING W REFLEX): HIV Screen 4th Generation wRfx: NONREACTIVE

## 2018-07-02 LAB — GLUCOSE, CAPILLARY: Glucose-Capillary: 117 mg/dL — ABNORMAL HIGH (ref 70–99)

## 2018-07-02 MED ORDER — LEVOFLOXACIN 500 MG PO TABS: 500.0000 mg | ORAL_TABLET | Freq: Every day | ORAL | Status: DC

## 2018-07-02 MED ORDER — CYCLOBENZAPRINE HCL 5 MG PO TABS: 5.0000 mg | ORAL_TABLET | Freq: Two times a day (BID) | ORAL | 0 refills | Status: AC | PRN

## 2018-07-02 MED ORDER — LEVOFLOXACIN 500 MG PO TABS: 500.0000 mg | ORAL_TABLET | Freq: Every day | ORAL | 0 refills | Status: AC

## 2018-07-02 MED ORDER — POTASSIUM CHLORIDE CRYS ER 20 MEQ PO TBCR
40.0000 meq | EXTENDED_RELEASE_TABLET | Freq: Once | ORAL | Status: AC
  Administered 2018-07-02: 40 meq via ORAL
  Filled 2018-07-02: qty 2

## 2018-07-02 NOTE — Progress Notes (Signed)
PHARMACIST - PHYSICIAN COMMUNICATION DR: Tressia Miners CONCERNING: Antibiotic IV to Oral Route Change Policy  RECOMMENDATION: This patient is receiving levofloxacin by the intravenous route.  Based on criteria approved by the Pharmacy and Therapeutics Committee, the antibiotic(s) is/are being converted to the equivalent oral dose form(s).   DESCRIPTION: These criteria include:  Patient being treated for a respiratory tract infection, urinary tract infection, cellulitis or clostridium difficile associated diarrhea if on metronidazole  The patient is not neutropenic and does not exhibit a GI malabsorption state  The patient is eating (either orally or via tube) and/or has been taking other orally administered medications for a least 24 hours  The patient is improving clinically and has a Tmax < 100.5  If you have questions about this conversion, please contact the Pharmacy Department  []   [phone] )  [facility] [x]   [phone] )  [facility] []   [phone] )  [facility] []   [phone] )  [facility] []   [phone] )  [place]. [place], [place].D., BCPS Clinical Pharmacist 07/02/18 09:19

## 2018-07-02 NOTE — Progress Notes (Signed)
Received Mde order to discharge patient to home  review home meds prescriptions and discharge instructions with patient and patient verbalized understanding

## 2018-07-02 NOTE — Progress Notes (Signed)
Inpatient Diabetes Program Recommendations  AACE/ADA: New Consensus Statement on Inpatient Glycemic Control (2015)  Target Ranges:  Prepandial:   less than 140 mg/dL      Peak postprandial:   less than 180 mg/dL (1-2 hours)      Critically ill patients:  140 - 180 mg/dL   Results for Amber Odonnell, Amber Odonnell (MRN [id number]) as of 07/02/2018 08:20  Ref. Range 07/01/2018 07:24 07/01/2018 11:33 07/01/2018 16:54 07/01/2018 21:14 07/02/2018 07:33  Glucose-Capillary Latest Ref Range: 70 - 99 mg/dL 214 (H)  Novolog 13 units  Lantus 15 units 295 (H)  Novolog 17 units 243 (H)  Novolog 13 units 282 (H)  Novolog 3 units  Lantus 20 units 117 (H)  Novolog 6 units  Lantus 20 units   Review of Glycemic Control  Diabetes history: DM2 Outpatient Diabetes medications: Tresiba 92 units QHS, Novolog 60 units TID with meals Current orders for Inpatient glycemic control: Lantus 20 units BID, Novolog 6 units TID with meals, Novolog 0-20 units TID with meals, Novolog 0-5 units QHS  Inpatient Diabetes Program Recommendations:  Insulin-Meal Coverage: Please consider increasing meal coverage to Novolog 10 units TID with meals. A1C: Please consider ordering an A1C to evaluate glycemic control over the past 2-3 months.  Thanks, Barnie Alderman, RN, MSN, CDE Diabetes Coordinator Inpatient Diabetes Program [phone] (Team Pager from 8am to 5pm)

## 2018-07-02 NOTE — Discharge Summary (Signed)
[place] [facility] [place] NAME: Amber Odonnell    MR#:  [id number]  DATE OF BIRTH:  12-Mar-1971  DATE OF ADMISSION:  06/30/2018   ADMITTING PHYSICIAN: Saundra Shelling, MD  DATE OF DISCHARGE: 07/02/2018 11:54 AM  PRIMARY CARE PHYSICIAN: Glean Hess, MD   ADMISSION DIAGNOSIS:   Hypoxia [R09.02] Nonspecific chest pain [R07.9] Community acquired pneumonia of left lower lobe of lung ([place]) [J18.1] Pneumonia [J18.9]  DISCHARGE DIAGNOSIS:   Active Problems:   Pneumonia   SECONDARY DIAGNOSIS:   Past Medical History:  Diagnosis Date  . Arthritis    of knee  . Asthma   . Chronic gastritis   . Cirrhosis of liver without ascites ([place])   . Diabetes mellitus without complication ([place])    type 2  . Dyspnea   . Generalized anxiety disorder   . GERD (gastroesophageal reflux disease)   . Hyperlipidemia   . Hypertension   . Hypothyroidism   . Irritable bowel syndrome (IBS)    w/ constipation  . Obesity (BMI 30-39.9)   . Portal hypertension ([place])   . Sleep apnea    CPAP  . Splenomegaly   . Thrombocytopenia [facility])     HOSPITAL COURSE:   47 year old female with past medical history significant for liver cirrhosis, diabetes mellitus, GERD gastritis, anxiety, hypertension presents to hospital secondary to worsening shortness of breath.   1.  Community-acquired pneumonia-improving -Continue Levaquin for now.  Cultures are negative -Added cough medicine and muscle relaxant for chest pain secondary to cough -She has been weaned off the oxygen and doing much better  2.  Thrombocytopenia-acute on chronic.  Secondary to liver disease, worsened by her infection -Slowly improving -No indication for transfusion.  No active bleeding. -Outpatient CBC within 1 week recommend  3.  Diabetes mellitus-on tresiba and novolog at home-advised to check sugars prior to taking the doses.  4.  Hypothyroidism-continue Synthroid  5.  Liver  cirrhosis-continue outpatient follow-up.  Patient on nadolol, lasix and prn lactulose.   Patient is active and ambulatory in the room Will be discharged today   DISCHARGE CONDITIONS:   Guarded  CONSULTS OBTAINED:   None  DRUG ALLERGIES:   Allergies  Allergen Reactions  . Hydrocodone Other (See Comments)    thrush   DISCHARGE MEDICATIONS:   Allergies as of 07/02/2018      Reactions   Hydrocodone Other (See Comments)   thrush      Medication List    STOP taking these medications   losartan 50 MG tablet Commonly known as:  COZAAR   magic mouthwash Soln   montelukast 10 MG tablet Commonly known as:  SINGULAIR   predniSONE 50 MG tablet Commonly known as:  DELTASONE   sertraline 50 MG tablet Commonly known as:  ZOLOFT     TAKE these medications   ALIVE ONCE DAILY WOMENS 50+ PO Take 1 tablet by mouth daily.   B-COMPLEX/B-12 SL Place 1 tablet under the tongue daily.   benzonatate 200 MG capsule Commonly known as:  TESSALON Take 1 capsule (200 mg total) by mouth 3 (three) times daily as needed for cough.   cyclobenzaprine 5 MG tablet Commonly known as:  FLEXERIL Take 1 tablet (5 mg total) by mouth 2 (two) times daily as needed for muscle spasms.   FLONASE 50 MCG/ACT nasal spray Generic drug:  fluticasone Place 2 sprays into both nostrils daily as needed for allergies.   furosemide 40 MG tablet Commonly known as:  LASIX Take 1 tablet (40 mg total) by mouth daily.   insulin aspart 100 UNIT/ML FlexPen Commonly known as:  NOVOLOG Inject 60 Units into the skin 3 (three) times daily with meals.   levofloxacin 500 MG tablet Commonly known as:  LEVAQUIN Take 1 tablet (500 mg total) by mouth daily for 5 days. Start taking on:  07/03/2018   levothyroxine 50 MCG tablet Commonly known as:  SYNTHROID, LEVOTHROID TAKE 1 TABLET(50 MCG) BY MOUTH DAILY BEFORE BREAKFAST What changed:  See the new instructions.   MEGARED OMEGA-3 KRILL OIL PO Take 1 tablet by  mouth at bedtime.   nadolol 20 MG tablet Commonly known as:  CORGARD Take 20 mg by mouth daily.   pantoprazole 40 MG tablet Commonly known as:  PROTONIX TAKE 1 TABLET BY MOUTH EVERY DAY   TRESIBA FLEXTOUCH [place] Inject 92 Units into the skin at bedtime.        DISCHARGE INSTRUCTIONS:   1. PCP f/u in 1-2 weeks 2. Gi f/u in 1-2 weeks   DIET:   Cardiac diet  ACTIVITY:   Activity as tolerated  OXYGEN:   Home Oxygen: No.  Oxygen Delivery: room air  DISCHARGE LOCATION:   home   If you experience worsening of your admission symptoms, develop shortness of breath, life threatening emergency, suicidal or homicidal thoughts you must seek medical attention immediately by calling 911 or calling your MD immediately  if symptoms less severe.  You Must read complete instructions/literature along with all the possible adverse reactions/side effects for all the Medicines you take and that have been prescribed to you. Take any new Medicines after you have completely understood and accpet all the possible adverse reactions/side effects.   Please note  You were cared for by a hospitalist during your hospital stay. If you have any questions about your discharge medications or the care you received while you were in the hospital after you are discharged, you can call the unit and asked to speak with the hospitalist on call if the hospitalist that took care of you is not available. Once you are discharged, your primary care physician will handle any further medical issues. Please note that NO REFILLS for any discharge medications will be authorized once you are discharged, as it is imperative that you return to your primary care physician (or establish a relationship with a primary care physician if you do not have one) for your aftercare needs so that they can reassess your need for medications and monitor your lab values.    On the day of Discharge:  VITAL SIGNS:   Blood pressure (!)  108/58, pulse 69, temperature 97.8 F (36.6 C), temperature source Oral, resp. rate 18, height 5\' 3"  (1.6 m), weight 72.6 kg, SpO2 100 %.  PHYSICAL EXAMINATION:   GENERAL:  47 y.o.-year-old patient lying in the bed with no acute distress.  EYES: Pupils equal, round, reactive to light and accommodation. No scleral icterus. Extraocular muscles intact.  HEENT: Head atraumatic, normocephalic. Oropharynx and nasopharynx clear.  NECK:  Supple, no jugular venous distention. No thyroid enlargement, no tenderness.  LUNGS: Normal breath sounds bilaterally, except decreased left basilar breath sounds.  No wheezing, rales,rhonchi or crepitation. No use of accessory muscles of respiration.  CARDIOVASCULAR: S1, S2 normal. No murmurs, rubs, or gallops.  ABDOMEN: Soft, nontender, nondistended. Bowel sounds present. No organomegaly or mass.  EXTREMITIES: No pedal edema, cyanosis, or clubbing.  NEUROLOGIC: Cranial nerves II through XII are intact. Muscle strength 5/5 in all extremities.  Sensation intact. Gait not checked.  PSYCHIATRIC: The patient is alert and oriented x 3.  SKIN: No obvious rash, lesion, or ulcer.   DATA REVIEW:   CBC Recent Labs  Lab 07/02/18 0333  WBC 2.8*  HGB 9.6*  HCT 29.4*  PLT 44*    Chemistries  Recent Labs  Lab 07/02/18 0333  NA 142  K 3.4*  CL 112*  CO2 24  GLUCOSE 162*  BUN 9  CREATININE 0.41*  CALCIUM 8.0*  AST 42*  ALT 29  ALKPHOS 157*  BILITOT 1.4*     Microbiology Results  Results for orders placed or performed during the hospital encounter of 07/05/17  CSF culture     Status: None   Collection Time: 07/05/17  2:15 PM  Result Value Ref Range Status   Specimen Description CSF  Final   Special Requests NONE  Final   Gram Stain NO ORGANISMS SEEN RARE WBC MOD RBC   Final   Culture   Final    NO GROWTH 3 DAYS Performed at [facility], [address], [place], [place] [address]    Report Status 07/09/2017 FINAL  Final    RADIOLOGY:    No results found.   Management plans discussed with the patient, family and they are in agreement.  CODE STATUS:  Code Status History    Date Active Date Inactive Code Status Order ID Comments User Context   06/30/2018 1203 07/02/2018 1500 Full Code [id number]  Saundra Shelling, MD Inpatient      TOTAL TIME TAKING CARE OF THIS PATIENT: 38  minutes.    Gladstone Lighter M.D on 07/02/2018 at 4:39 PM  Between 7am to 6pm - Pager - [phone]  After 6pm go to [email or web address] - Proofreader  [facility]  Office  [phone]  CC: Primary care physician; Glean Hess, MD   Note: This dictation was prepared with Dragon dictation along with smaller phrase technology. Any transcriptional errors that result from this process are unintentional.

## 2018-07-05 ENCOUNTER — Telehealth: Payer: Self-pay

## 2018-07-05 NOTE — Telephone Encounter (Signed)
Transition Care Management Follow-up Telephone Call  Date of discharge and from where: 07/02/18 from [facility]  How have you been since you were released from the hospital? States she still feels fatigue and still having a productive cough. Denies fever, chills, dyspnea, chest pain/pressure with inspiration. States phlegm is thin, clear to white in color.  Any questions or concerns? No   Items Reviewed:  Did the pt receive and understand the discharge instructions provided? Yes   Medications obtained and verified? Yes   Any new allergies since your discharge? No   Dietary orders reviewed? Yes  Do you have support at home? Yes   Other (ie: DME, Home Health, etc) N/A  Functional Questionnaire: (I = Independent and D = Dependent) ADL's: I  Bathing/Dressing- I   Meal Prep- I  Eating- I  Maintaining continence- I  Transferring/Ambulation- I  Managing Meds- I   Follow up appointments reviewed:    PCP Hospital f/u appt confirmed? Yes  Scheduled to see Dr. Army Melia on 07/12/18 @ 2:20pm.  [facility] f/u appt confirmed? N/A  Are transportation arrangements needed? No   If their condition worsens, is the pt aware to call  their PCP or go to the ED? Yes  Was the patient provided with contact information for the PCP's office or ED? Yes  Was the pt encouraged to call back with questions or concerns? Yes

## 2018-07-12 ENCOUNTER — Ambulatory Visit: Payer: BLUE CROSS/BLUE SHIELD | Admitting: Internal Medicine

## 2018-07-12 ENCOUNTER — Encounter: Payer: Self-pay | Admitting: Internal Medicine

## 2018-07-12 VITALS — BP 122/64 | HR 99 | Temp 98.6°F | Ht 63.0 in | Wt 161.0 lb

## 2018-07-12 DIAGNOSIS — J181 Lobar pneumonia, unspecified organism: Secondary | ICD-10-CM | POA: Diagnosis not present

## 2018-07-12 DIAGNOSIS — J189 Pneumonia, unspecified organism: Secondary | ICD-10-CM

## 2018-07-12 NOTE — Progress Notes (Signed)
Date:  07/12/2018   Name:  Amber Odonnell   DOB:  15-Jul-1971   MRN:  [id number]   Chief Complaint: Hospitalization Follow-up (Pnuemonia follow up. Was seen at [facility]. Feeling fatigued. Cough is still there and hurting to lay on side. The pearls are not helping with cough. Last two days coughing up clear mucous and when laying down at night feels like "cuts off air and cannot breathe.") 9/225/19: CXR: IMPRESSION: Hypoinflation of the lungs. Mild left basilar subsegmental atelectasis or pneumonia is noted. Followup PA and lateral chest X-ray is recommended in 3-4 weeks following trial of antibiotic therapy to ensure resolution and exclude underlying malignancy. Admitted to [facility] 9/25 - 07/02/18 for hypoxia and pneumonia.  Treated with Levaquin.  Cultures were negative. WBC and platelets were low.   Hgb decreased slightly. Pneumonia  She complains of chest tightness and cough. There is no shortness of breath, sputum production or wheezing. The problem has been gradually improving. The cough is non-productive. Pertinent negatives include no chest pain, fever or headaches. Her symptoms are alleviated by prescription cough suppressant (tessalon not helping much).  Liver disease - seeing transplant medicine in about a month.  Has completed all the Hep A/B vaccines.  Also had PPV-23 in [place].  She will try to get the records from [facility].   Review of Systems  Constitutional: Positive for fatigue. Negative for chills and fever.  Respiratory: Positive for cough. Negative for sputum production, chest tightness, shortness of breath and wheezing.   Cardiovascular: Negative for chest pain, palpitations and leg swelling.  Gastrointestinal: Negative for diarrhea, nausea and vomiting.  Neurological: Negative for dizziness and headaches.    Patient Active Problem List   Diagnosis Date Noted  . Pneumonia 06/30/2018  . Iron deficiency anemia secondary to blood loss (chronic)   . Secondary esophageal varices  without bleeding ([place])   . Renal insufficiency 07/14/2017  . Anemia 07/14/2017  . Enlarged pituitary gland ([place]) 07/10/2017  . Headache syndrome 07/10/2017  . Secondary esophageal varices with bleeding ([place])   . Vaginal flatus 03/13/2017  . Eye discharge 03/13/2017  . Esophageal varices determined by endoscopy ([place]) 03/11/2017  . Abnormal feces   . Polyp of sigmoid colon   . Gastritis without bleeding   . Environmental and seasonal allergies 01/21/2017  . Type 2 diabetes mellitus without complication, with long-term current use of insulin ([place]) 01/21/2017  . Hypothyroidism due to acquired atrophy of thyroid 01/21/2017  . Hyperlipidemia associated with type 2 diabetes mellitus ([place]) 01/21/2017  . Irritable bowel syndrome with constipation 01/21/2017  . Obstructive sleep apnea syndrome 01/21/2017  . Thrombocytopenia ([place])   . Splenomegaly   . Portal hypertension ([place])   . Obesity (BMI 30-39.9)   . Essential hypertension   . Hyperlipidemia   . Generalized anxiety disorder   . Hepatic cirrhosis ([place])   . Chronic gastritis   . Arthritis     Allergies  Allergen Reactions  . Hydrocodone Other (See Comments)    thrush    Past Surgical History:  Procedure Laterality Date  . COLONOSCOPY  10/08/2017   No polyps or mass [facility])  . COLONOSCOPY WITH PROPOFOL N/A 07/24/2017   Procedure: COLONOSCOPY WITH PROPOFOL;  Surgeon: [name], MD;  Location: [facility];  Service: Endoscopy;  Laterality: N/A;  . ESOPHAGOGASTRODUODENOSCOPY (EGD) WITH PROPOFOL N/A 02/26/2017   Procedure: ESOPHAGOGASTRODUODENOSCOPY (EGD) WITH Banding;  Surgeon: [name], MD;  Location: [facility];  Service: Endoscopy;  Laterality: N/A;  . ESOPHAGOGASTRODUODENOSCOPY (EGD)  WITH PROPOFOL N/A 04/10/2017   Procedure: ESOPHAGOGASTRODUODENOSCOPY (EGD) WITH PROPOFOL;  Surgeon: [name], MD;  Location: [place];  Service: Endoscopy;  Laterality: N/A;  Diabetic  sleep apnea  with banding    . ESOPHAGOGASTRODUODENOSCOPY (EGD) WITH PROPOFOL N/A 07/24/2017   Procedure: ESOPHAGOGASTRODUODENOSCOPY (EGD) WITH PROPOFOL;  Surgeon: [name], MD;  Location: [place];  Service: Endoscopy;  Laterality: N/A;  Diabetic - insulin  sleep apnea  . TONSILLECTOMY    . TOTAL VAGINAL HYSTERECTOMY     endometriosis    Social History   Tobacco Use  . Smoking status: Never Smoker  . Smokeless tobacco: Never Used  Substance Use Topics  . Alcohol use: No  . Drug use: No     Medication list has been reviewed and updated.  Current Meds  Medication Sig  . B Complex Vitamins (B-COMPLEX/B-12 SL) Place 1 tablet under the tongue daily.   . benzonatate (TESSALON) 200 MG capsule Take 1 capsule (200 mg total) by mouth 3 (three) times daily as needed for cough.  . cyclobenzaprine (FLEXERIL) 5 MG tablet Take 1 tablet (5 mg total) by mouth 2 (two) times daily as needed for muscle spasms.  . fluticasone (FLONASE) 50 MCG/ACT nasal spray Place 2 sprays into both nostrils daily as needed for allergies.   . furosemide (LASIX) 40 MG tablet Take 1 tablet (40 mg total) by mouth daily.  . insulin aspart (NOVOLOG FLEXPEN) 100 UNIT/ML FlexPen Inject 60 Units into the skin 3 (three) times daily with meals.  . Insulin Degludec (TRESIBA FLEXTOUCH [place]) Inject 92 Units into the skin at bedtime.   Marland Kitchen levothyroxine (SYNTHROID, LEVOTHROID) 50 MCG tablet TAKE 1 TABLET(50 MCG) BY MOUTH DAILY BEFORE BREAKFAST (Patient taking differently: Take 50 mcg by mouth daily before breakfast. )  . MEGARED OMEGA-3 KRILL OIL PO Take 1 tablet by mouth at bedtime.   . Multiple Vitamins-Minerals (ALIVE ONCE DAILY WOMENS 50+ PO) Take 1 tablet by mouth daily.   . nadolol (CORGARD) 20 MG tablet Take 20 mg by mouth daily.   . pantoprazole (PROTONIX) 40 MG tablet TAKE 1 TABLET BY MOUTH EVERY DAY    PHQ 2/9 Scores 08/05/2017 07/10/2017  PHQ - 2 Score 0 0    Physical Exam  Constitutional: She is oriented to person, place, and  time. She appears well-developed. No distress.  HENT:  Head: Normocephalic and atraumatic.  Neck: Normal range of motion. Neck supple.  Cardiovascular: Normal rate, regular rhythm and normal heart sounds.  Pulmonary/Chest: Effort normal and breath sounds normal. No respiratory distress. She has no wheezes.  Musculoskeletal: Normal range of motion.  Lymphadenopathy:    She has no cervical adenopathy.  Neurological: She is alert and oriented to person, place, and time.  Skin: Skin is warm and dry. No rash noted.  Psychiatric: She has a normal mood and affect. Her behavior is normal. Thought content normal.  Nursing note and vitals reviewed.   BP 122/64 (BP Location: Right Arm, Patient Position: Sitting, Cuff Size: Normal)   Pulse 99   Temp 98.6 F (37 C) (Oral)   Ht 5\' 3"  (1.6 m)   Wt 161 lb (73 kg)   SpO2 100%   BMI 28.52 kg/m   Assessment and Plan: 1. Community acquired pneumonia of left lower lobe of lung ([place]) CXR in 1-2 weeks Flu vaccine once CXR clear - DG Chest 2 View; Future - CBC with Differential/Platelet   Partially dictated using Editor, commissioning. Any errors are unintentional.  Halina Maidens,  MD [facility]  07/12/2018

## 2018-07-12 NOTE — Patient Instructions (Addendum)
Delsym cough syrup  Send me the [facility] immunization dates

## 2018-07-13 LAB — CBC WITH DIFFERENTIAL/PLATELET
Basophils Absolute: 0 10*3/uL (ref 0.0–0.2)
Basos: 1 %
EOS (ABSOLUTE): 0.1 10*3/uL (ref 0.0–0.4)
Eos: 2 %
Hematocrit: 35 % (ref 34.0–46.6)
Hemoglobin: 11.2 g/dL (ref 11.1–15.9)
IMMATURE GRANS (ABS): 0 10*3/uL (ref 0.0–0.1)
Immature Granulocytes: 0 %
LYMPHS: 30 %
Lymphocytes Absolute: 1.2 10*3/uL (ref 0.7–3.1)
MCH: 27.4 pg (ref 26.6–33.0)
MCHC: 32 g/dL (ref 31.5–35.7)
MCV: 86 fL (ref 79–97)
MONOCYTES: 10 %
Monocytes Absolute: 0.4 10*3/uL (ref 0.1–0.9)
Neutrophils Absolute: 2.4 10*3/uL (ref 1.4–7.0)
Neutrophils: 57 %
PLATELETS: 83 10*3/uL — AB (ref 150–450)
RBC: 4.09 x10E6/uL (ref 3.77–5.28)
RDW: 18 % — ABNORMAL HIGH (ref 12.3–15.4)
WBC: 4.1 10*3/uL (ref 3.4–10.8)

## 2018-07-16 ENCOUNTER — Ambulatory Visit: Payer: BLUE CROSS/BLUE SHIELD

## 2018-07-20 ENCOUNTER — Ambulatory Visit
Admission: RE | Admit: 2018-07-20 | Discharge: 2018-07-20 | Disposition: A | Discharge status: Home / Self Care | Payer: BLUE CROSS/BLUE SHIELD | Source: Ambulatory Visit | Attending: Internal Medicine | Admitting: Internal Medicine

## 2018-07-20 DIAGNOSIS — J189 Pneumonia, unspecified organism: Secondary | ICD-10-CM

## 2018-07-20 DIAGNOSIS — Z8701 Personal history of pneumonia (recurrent): Secondary | ICD-10-CM | POA: Diagnosis not present

## 2018-07-20 DIAGNOSIS — Z09 Encounter for follow-up examination after completed treatment for conditions other than malignant neoplasm: Secondary | ICD-10-CM | POA: Insufficient documentation

## 2018-07-20 DIAGNOSIS — J181 Lobar pneumonia, unspecified organism: Principal | ICD-10-CM

## 2018-09-01 DIAGNOSIS — M65332 Trigger finger, left middle finger: Secondary | ICD-10-CM | POA: Diagnosis not present

## 2018-09-01 DIAGNOSIS — M65331 Trigger finger, right middle finger: Secondary | ICD-10-CM | POA: Diagnosis not present

## 2018-09-21 DIAGNOSIS — I10 Essential (primary) hypertension: Secondary | ICD-10-CM | POA: Diagnosis not present

## 2018-09-21 DIAGNOSIS — M1710 Unilateral primary osteoarthritis, unspecified knee: Secondary | ICD-10-CM | POA: Diagnosis not present

## 2018-09-21 DIAGNOSIS — G4733 Obstructive sleep apnea (adult) (pediatric): Secondary | ICD-10-CM | POA: Diagnosis not present

## 2018-09-21 DIAGNOSIS — R040 Epistaxis: Secondary | ICD-10-CM | POA: Diagnosis not present

## 2018-09-21 DIAGNOSIS — Z9989 Dependence on other enabling machines and devices: Secondary | ICD-10-CM | POA: Diagnosis not present

## 2018-09-21 DIAGNOSIS — M542 Cervicalgia: Secondary | ICD-10-CM | POA: Diagnosis not present

## 2018-09-21 DIAGNOSIS — I8312 Varicose veins of left lower extremity with inflammation: Secondary | ICD-10-CM | POA: Diagnosis not present

## 2018-09-21 DIAGNOSIS — E039 Hypothyroidism, unspecified: Secondary | ICD-10-CM | POA: Diagnosis not present

## 2018-09-21 DIAGNOSIS — K7581 Nonalcoholic steatohepatitis (NASH): Secondary | ICD-10-CM | POA: Diagnosis not present

## 2018-09-21 DIAGNOSIS — J329 Chronic sinusitis, unspecified: Secondary | ICD-10-CM | POA: Diagnosis not present

## 2018-09-21 DIAGNOSIS — E669 Obesity, unspecified: Secondary | ICD-10-CM | POA: Diagnosis not present

## 2018-09-21 DIAGNOSIS — D696 Thrombocytopenia, unspecified: Secondary | ICD-10-CM | POA: Diagnosis not present

## 2018-09-21 DIAGNOSIS — E119 Type 2 diabetes mellitus without complications: Secondary | ICD-10-CM | POA: Diagnosis not present

## 2018-09-21 DIAGNOSIS — E785 Hyperlipidemia, unspecified: Secondary | ICD-10-CM | POA: Diagnosis not present

## 2018-09-21 DIAGNOSIS — I8311 Varicose veins of right lower extremity with inflammation: Secondary | ICD-10-CM | POA: Diagnosis not present

## 2018-09-21 DIAGNOSIS — I83899 Varicose veins of unspecified lower extremities with other complications: Secondary | ICD-10-CM | POA: Diagnosis not present

## 2018-09-24 IMAGING — US US ABDOMEN LIMITED
1 series · 14 of 25 positions shown · non-contrast
Comparison: 02/23/2017

CLINICAL DATA: Abdominal pain, cirrhosis

EXAM:
ULTRASOUND ABDOMEN LIMITED RIGHT UPPER QUADRANT

[Series 1: us abdomen limited · 0.20mm/px · 14 of 57 slices shown]
[im 1/57]
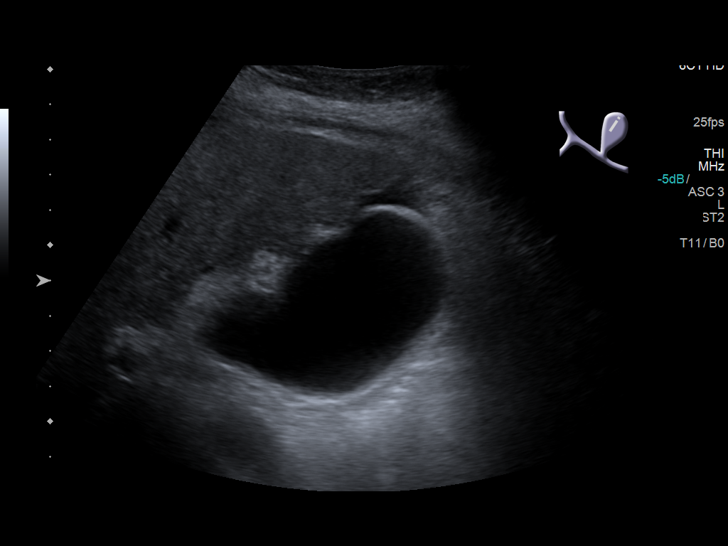
[im 5/57]
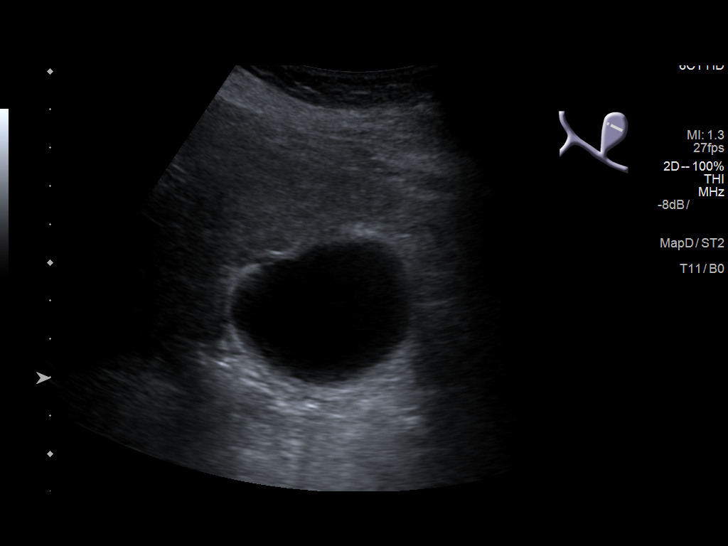
[im 10/57]
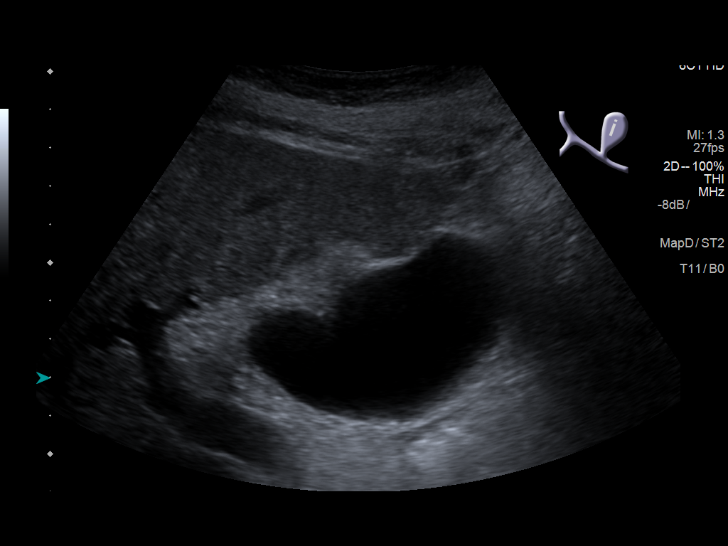
[im 15/57]
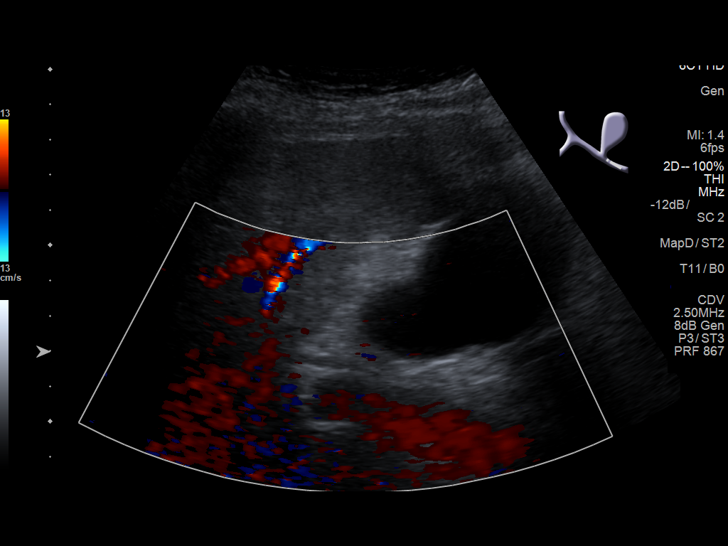
[im 19/57]
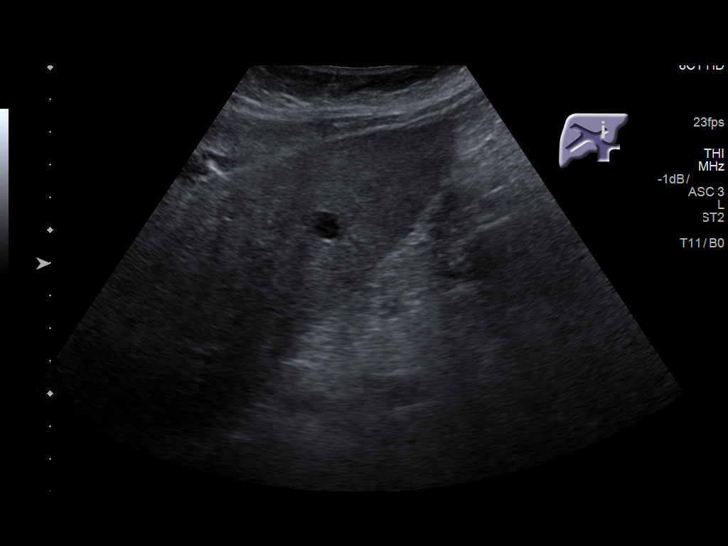
[im 22/57]
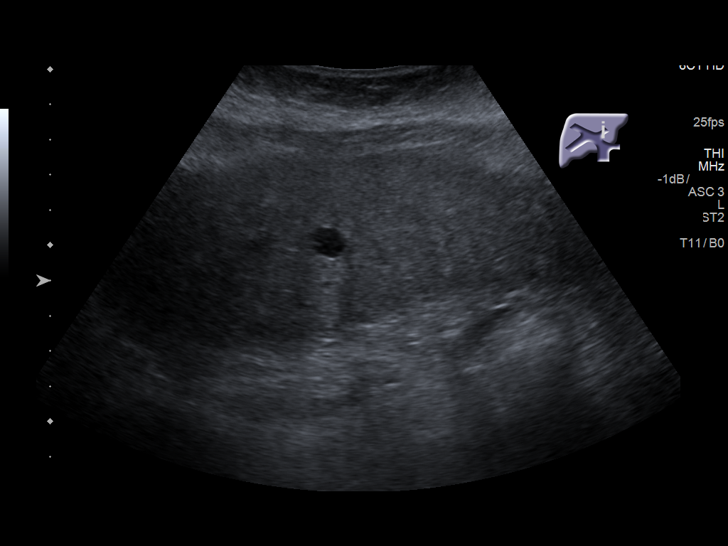
[im 26/57]
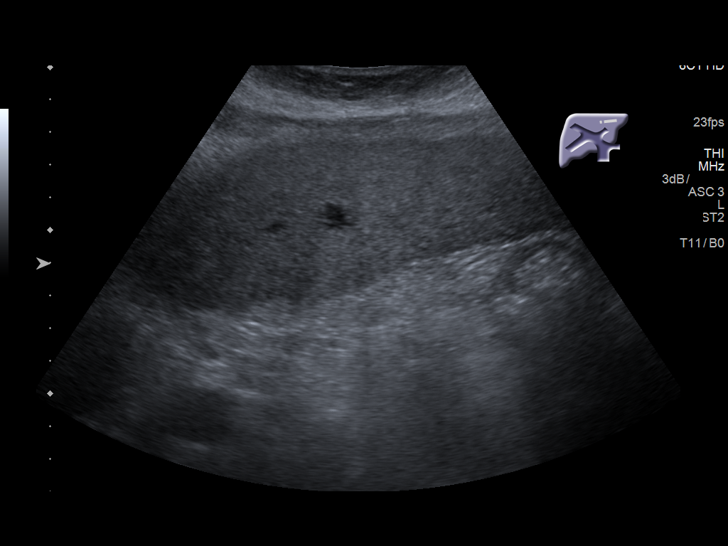
[im 31/57]
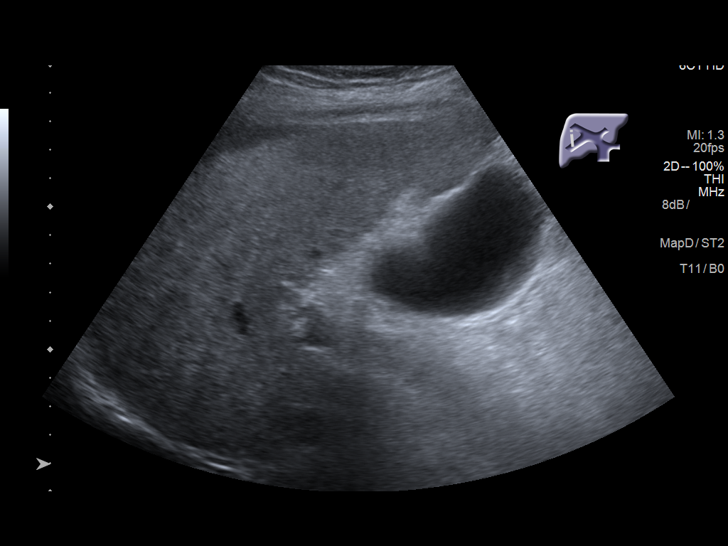
[im 36/57]
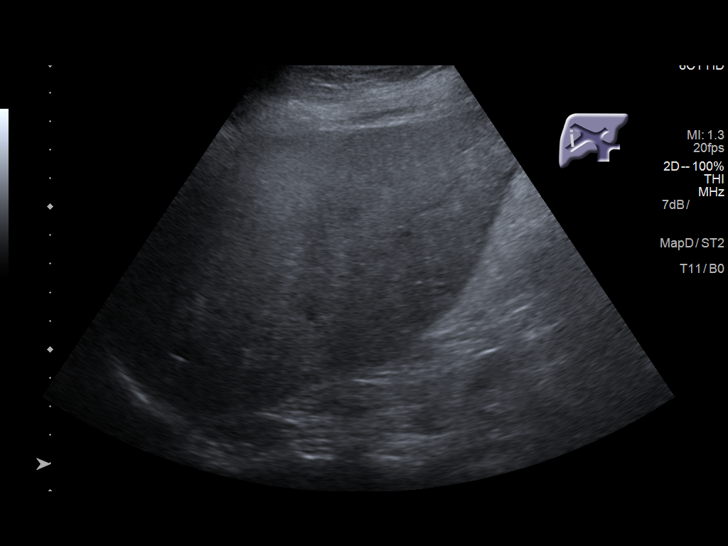
[im 38/57]
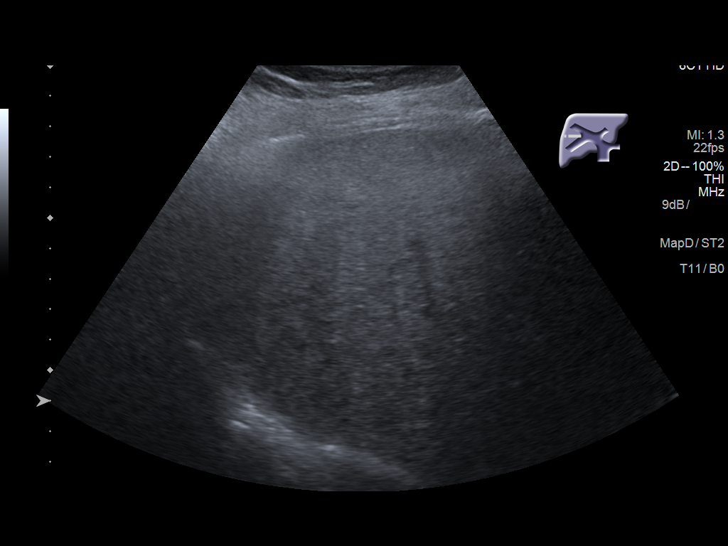
[im 43/57]
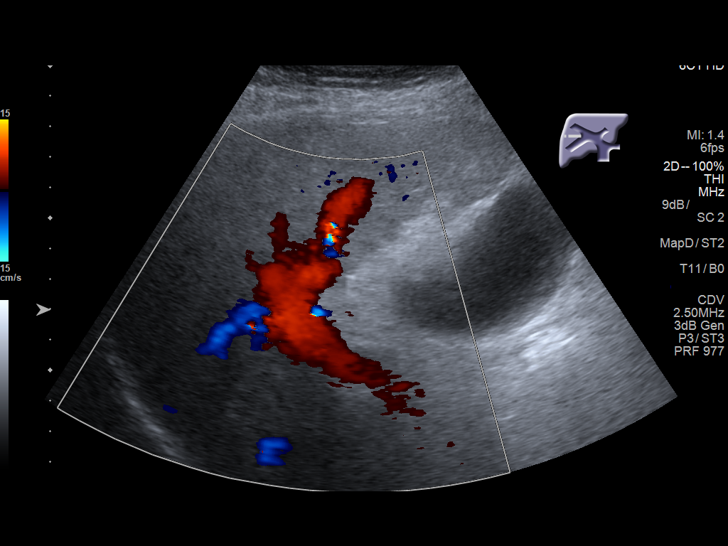
[im 47/57]
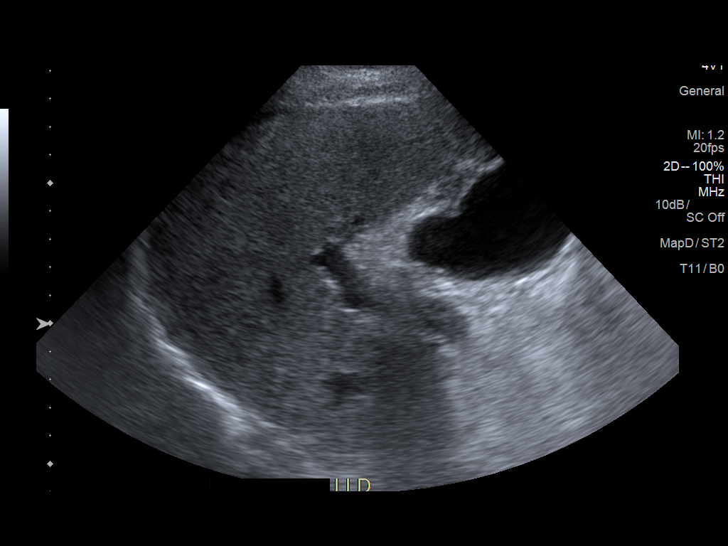
[im 52/57]
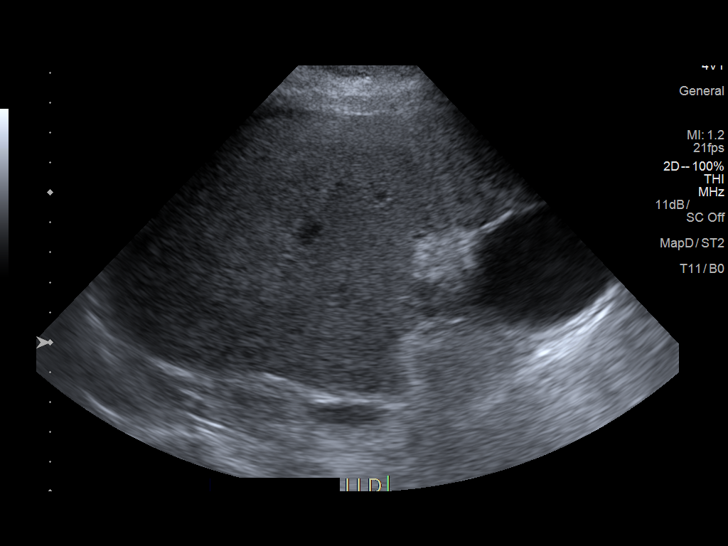
[im 57/57]
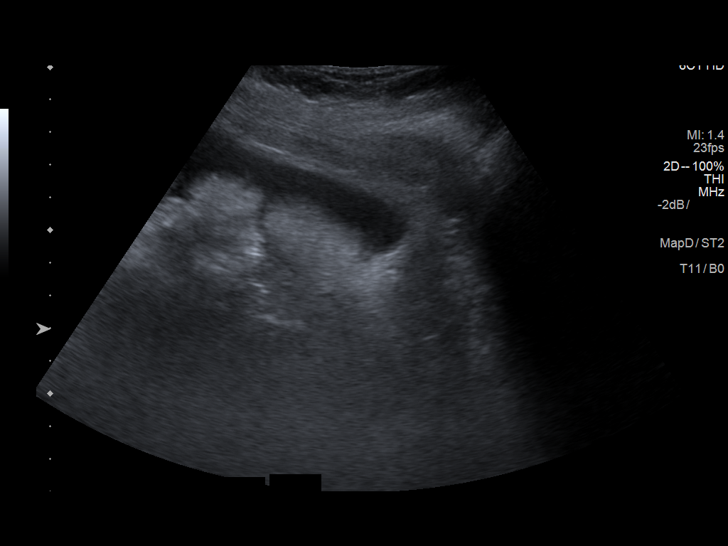

[14 of 25 positions shown; findings below may reference images not displayed]

FINDINGS: Gallbladder:

No gallstones or wall thickening visualized. No sonographic Murphy
sign noted by sonographer.

Common bile duct:

Diameter: 2.8 mm

Liver:

Heterogeneous hepatic echotexture with a nodular contour. 9 x 7 x 9
mm anechoic left hepatic mass most consistent with a small cyst.
Small amount of perihepatic ascites. Portal vein is patent on color
Doppler imaging with normal direction of blood flow towards the
liver.
IMPRESSION: 1. Findings consistent with cirrhosis. No solid hepatic mass. Small
amount of ascites.

## 2018-10-12 DIAGNOSIS — J0100 Acute maxillary sinusitis, unspecified: Secondary | ICD-10-CM | POA: Diagnosis not present

## 2018-10-20 DIAGNOSIS — G4733 Obstructive sleep apnea (adult) (pediatric): Secondary | ICD-10-CM | POA: Diagnosis not present

## 2018-10-22 DIAGNOSIS — Z794 Long term (current) use of insulin: Secondary | ICD-10-CM | POA: Diagnosis not present

## 2018-10-22 DIAGNOSIS — K760 Fatty (change of) liver, not elsewhere classified: Secondary | ICD-10-CM | POA: Diagnosis not present

## 2018-10-22 DIAGNOSIS — E1165 Type 2 diabetes mellitus with hyperglycemia: Secondary | ICD-10-CM | POA: Diagnosis not present

## 2018-10-22 DIAGNOSIS — E113291 Type 2 diabetes mellitus with mild nonproliferative diabetic retinopathy without macular edema, right eye: Secondary | ICD-10-CM | POA: Diagnosis not present

## 2018-10-22 DIAGNOSIS — E039 Hypothyroidism, unspecified: Secondary | ICD-10-CM | POA: Diagnosis not present

## 2018-12-13 DIAGNOSIS — M1710 Unilateral primary osteoarthritis, unspecified knee: Secondary | ICD-10-CM | POA: Diagnosis not present

## 2018-12-13 DIAGNOSIS — Z794 Long term (current) use of insulin: Secondary | ICD-10-CM | POA: Diagnosis not present

## 2018-12-13 DIAGNOSIS — K7469 Other cirrhosis of liver: Secondary | ICD-10-CM | POA: Diagnosis not present

## 2018-12-13 DIAGNOSIS — K7581 Nonalcoholic steatohepatitis (NASH): Secondary | ICD-10-CM | POA: Diagnosis not present

## 2018-12-13 DIAGNOSIS — E46 Unspecified protein-calorie malnutrition: Secondary | ICD-10-CM | POA: Diagnosis not present

## 2018-12-13 DIAGNOSIS — I8510 Secondary esophageal varices without bleeding: Secondary | ICD-10-CM | POA: Diagnosis not present

## 2018-12-13 DIAGNOSIS — M6530 Trigger finger, unspecified finger: Secondary | ICD-10-CM | POA: Diagnosis not present

## 2018-12-13 DIAGNOSIS — E785 Hyperlipidemia, unspecified: Secondary | ICD-10-CM | POA: Diagnosis not present

## 2018-12-13 DIAGNOSIS — I10 Essential (primary) hypertension: Secondary | ICD-10-CM | POA: Diagnosis not present

## 2018-12-13 DIAGNOSIS — D696 Thrombocytopenia, unspecified: Secondary | ICD-10-CM | POA: Diagnosis not present

## 2018-12-13 DIAGNOSIS — K7460 Unspecified cirrhosis of liver: Secondary | ICD-10-CM | POA: Diagnosis not present

## 2018-12-13 DIAGNOSIS — R188 Other ascites: Secondary | ICD-10-CM | POA: Diagnosis not present

## 2018-12-13 DIAGNOSIS — K766 Portal hypertension: Secondary | ICD-10-CM | POA: Diagnosis not present

## 2018-12-13 DIAGNOSIS — E119 Type 2 diabetes mellitus without complications: Secondary | ICD-10-CM | POA: Diagnosis not present

## 2018-12-13 DIAGNOSIS — R5383 Other fatigue: Secondary | ICD-10-CM | POA: Diagnosis not present

## 2018-12-13 DIAGNOSIS — R031 Nonspecific low blood-pressure reading: Secondary | ICD-10-CM | POA: Diagnosis not present

## 2018-12-13 DIAGNOSIS — E039 Hypothyroidism, unspecified: Secondary | ICD-10-CM | POA: Diagnosis not present

## 2018-12-21 DIAGNOSIS — E559 Vitamin D deficiency, unspecified: Secondary | ICD-10-CM | POA: Diagnosis not present

## 2018-12-21 DIAGNOSIS — Z794 Long term (current) use of insulin: Secondary | ICD-10-CM | POA: Diagnosis not present

## 2018-12-21 DIAGNOSIS — I10 Essential (primary) hypertension: Secondary | ICD-10-CM | POA: Diagnosis not present

## 2018-12-21 DIAGNOSIS — E039 Hypothyroidism, unspecified: Secondary | ICD-10-CM | POA: Diagnosis not present

## 2018-12-21 DIAGNOSIS — E1165 Type 2 diabetes mellitus with hyperglycemia: Secondary | ICD-10-CM | POA: Diagnosis not present

## 2018-12-23 DIAGNOSIS — M79641 Pain in right hand: Secondary | ICD-10-CM | POA: Diagnosis not present

## 2018-12-23 DIAGNOSIS — M79642 Pain in left hand: Secondary | ICD-10-CM | POA: Diagnosis not present

## 2018-12-25 ENCOUNTER — Other Ambulatory Visit: Payer: Self-pay | Admitting: Internal Medicine

## 2018-12-29 DIAGNOSIS — M79641 Pain in right hand: Secondary | ICD-10-CM | POA: Diagnosis not present

## 2018-12-29 DIAGNOSIS — M79642 Pain in left hand: Secondary | ICD-10-CM | POA: Diagnosis not present

## 2019-01-03 DIAGNOSIS — Z794 Long term (current) use of insulin: Secondary | ICD-10-CM | POA: Diagnosis not present

## 2019-01-03 DIAGNOSIS — K7581 Nonalcoholic steatohepatitis (NASH): Secondary | ICD-10-CM | POA: Diagnosis not present

## 2019-01-03 DIAGNOSIS — E039 Hypothyroidism, unspecified: Secondary | ICD-10-CM | POA: Diagnosis not present

## 2019-01-03 DIAGNOSIS — I10 Essential (primary) hypertension: Secondary | ICD-10-CM | POA: Diagnosis not present

## 2019-01-05 DIAGNOSIS — E1165 Type 2 diabetes mellitus with hyperglycemia: Secondary | ICD-10-CM | POA: Diagnosis not present

## 2019-01-05 DIAGNOSIS — K7460 Unspecified cirrhosis of liver: Secondary | ICD-10-CM | POA: Diagnosis not present

## 2019-01-07 DIAGNOSIS — G4733 Obstructive sleep apnea (adult) (pediatric): Secondary | ICD-10-CM | POA: Diagnosis not present

## 2019-01-12 DIAGNOSIS — E1165 Type 2 diabetes mellitus with hyperglycemia: Secondary | ICD-10-CM | POA: Diagnosis not present

## 2019-02-01 DIAGNOSIS — R748 Abnormal levels of other serum enzymes: Secondary | ICD-10-CM | POA: Diagnosis not present

## 2019-02-03 DIAGNOSIS — M79641 Pain in right hand: Secondary | ICD-10-CM | POA: Diagnosis not present

## 2019-02-07 DIAGNOSIS — E119 Type 2 diabetes mellitus without complications: Secondary | ICD-10-CM | POA: Diagnosis not present

## 2019-02-07 DIAGNOSIS — G4730 Sleep apnea, unspecified: Secondary | ICD-10-CM | POA: Diagnosis not present

## 2019-02-07 DIAGNOSIS — K219 Gastro-esophageal reflux disease without esophagitis: Secondary | ICD-10-CM | POA: Diagnosis not present

## 2019-02-07 DIAGNOSIS — M65331 Trigger finger, right middle finger: Secondary | ICD-10-CM | POA: Diagnosis not present

## 2019-02-07 DIAGNOSIS — E039 Hypothyroidism, unspecified: Secondary | ICD-10-CM | POA: Diagnosis not present

## 2019-02-07 DIAGNOSIS — Z794 Long term (current) use of insulin: Secondary | ICD-10-CM | POA: Diagnosis not present

## 2019-02-14 DIAGNOSIS — Z794 Long term (current) use of insulin: Secondary | ICD-10-CM | POA: Diagnosis not present

## 2019-02-14 DIAGNOSIS — E1165 Type 2 diabetes mellitus with hyperglycemia: Secondary | ICD-10-CM | POA: Diagnosis not present

## 2019-02-14 DIAGNOSIS — E039 Hypothyroidism, unspecified: Secondary | ICD-10-CM | POA: Diagnosis not present

## 2019-02-14 DIAGNOSIS — R748 Abnormal levels of other serum enzymes: Secondary | ICD-10-CM | POA: Diagnosis not present

## 2019-02-17 DIAGNOSIS — M79641 Pain in right hand: Secondary | ICD-10-CM | POA: Diagnosis not present

## 2019-03-01 DIAGNOSIS — D229 Melanocytic nevi, unspecified: Secondary | ICD-10-CM | POA: Diagnosis not present

## 2019-03-01 DIAGNOSIS — L820 Inflamed seborrheic keratosis: Secondary | ICD-10-CM | POA: Diagnosis not present

## 2019-03-01 DIAGNOSIS — L821 Other seborrheic keratosis: Secondary | ICD-10-CM | POA: Diagnosis not present

## 2019-03-01 DIAGNOSIS — D239 Other benign neoplasm of skin, unspecified: Secondary | ICD-10-CM | POA: Diagnosis not present

## 2019-03-01 DIAGNOSIS — D1801 Hemangioma of skin and subcutaneous tissue: Secondary | ICD-10-CM | POA: Diagnosis not present

## 2019-03-08 DIAGNOSIS — M79642 Pain in left hand: Secondary | ICD-10-CM | POA: Diagnosis not present

## 2019-03-08 DIAGNOSIS — M13842 Other specified arthritis, left hand: Secondary | ICD-10-CM | POA: Diagnosis not present

## 2019-03-24 DIAGNOSIS — R040 Epistaxis: Secondary | ICD-10-CM | POA: Diagnosis not present

## 2019-03-24 DIAGNOSIS — J069 Acute upper respiratory infection, unspecified: Secondary | ICD-10-CM | POA: Diagnosis not present

## 2019-03-24 DIAGNOSIS — E119 Type 2 diabetes mellitus without complications: Secondary | ICD-10-CM | POA: Diagnosis not present

## 2019-03-24 DIAGNOSIS — K7581 Nonalcoholic steatohepatitis (NASH): Secondary | ICD-10-CM | POA: Diagnosis not present

## 2019-03-24 DIAGNOSIS — K7460 Unspecified cirrhosis of liver: Secondary | ICD-10-CM | POA: Diagnosis not present

## 2019-03-24 DIAGNOSIS — R188 Other ascites: Secondary | ICD-10-CM | POA: Diagnosis not present

## 2019-03-24 DIAGNOSIS — R05 Cough: Secondary | ICD-10-CM | POA: Diagnosis not present

## 2019-03-25 DIAGNOSIS — R17 Unspecified jaundice: Secondary | ICD-10-CM | POA: Diagnosis not present

## 2019-03-26 DIAGNOSIS — R17 Unspecified jaundice: Secondary | ICD-10-CM | POA: Diagnosis not present

## 2019-03-26 DIAGNOSIS — K8020 Calculus of gallbladder without cholecystitis without obstruction: Secondary | ICD-10-CM | POA: Diagnosis not present

## 2019-03-26 DIAGNOSIS — K7689 Other specified diseases of liver: Secondary | ICD-10-CM | POA: Diagnosis not present

## 2019-03-26 DIAGNOSIS — K7460 Unspecified cirrhosis of liver: Secondary | ICD-10-CM | POA: Diagnosis not present

## 2019-03-28 DIAGNOSIS — R300 Dysuria: Secondary | ICD-10-CM | POA: Diagnosis not present

## 2019-03-30 DIAGNOSIS — K8020 Calculus of gallbladder without cholecystitis without obstruction: Secondary | ICD-10-CM | POA: Diagnosis not present

## 2019-03-30 DIAGNOSIS — R0602 Shortness of breath: Secondary | ICD-10-CM | POA: Diagnosis not present

## 2019-03-30 DIAGNOSIS — J90 Pleural effusion, not elsewhere classified: Secondary | ICD-10-CM | POA: Diagnosis not present

## 2019-03-30 DIAGNOSIS — D473 Essential (hemorrhagic) thrombocythemia: Secondary | ICD-10-CM | POA: Diagnosis not present

## 2019-03-30 DIAGNOSIS — R05 Cough: Secondary | ICD-10-CM | POA: Diagnosis not present

## 2019-03-31 DIAGNOSIS — R05 Cough: Secondary | ICD-10-CM | POA: Diagnosis not present

## 2019-03-31 DIAGNOSIS — R188 Other ascites: Secondary | ICD-10-CM | POA: Diagnosis not present

## 2019-03-31 DIAGNOSIS — K7460 Unspecified cirrhosis of liver: Secondary | ICD-10-CM | POA: Diagnosis not present

## 2019-03-31 DIAGNOSIS — K7581 Nonalcoholic steatohepatitis (NASH): Secondary | ICD-10-CM | POA: Diagnosis not present

## 2019-03-31 DIAGNOSIS — J90 Pleural effusion, not elsewhere classified: Secondary | ICD-10-CM | POA: Diagnosis not present

## 2019-03-31 DIAGNOSIS — E1165 Type 2 diabetes mellitus with hyperglycemia: Secondary | ICD-10-CM | POA: Diagnosis not present

## 2019-03-31 DIAGNOSIS — R0602 Shortness of breath: Secondary | ICD-10-CM | POA: Diagnosis not present

## 2019-04-01 DIAGNOSIS — K760 Fatty (change of) liver, not elsewhere classified: Secondary | ICD-10-CM | POA: Diagnosis not present

## 2019-04-01 DIAGNOSIS — R140 Abdominal distension (gaseous): Secondary | ICD-10-CM | POA: Diagnosis not present

## 2019-04-01 DIAGNOSIS — R0602 Shortness of breath: Secondary | ICD-10-CM | POA: Diagnosis not present

## 2019-04-01 DIAGNOSIS — K7290 Hepatic failure, unspecified without coma: Secondary | ICD-10-CM | POA: Diagnosis not present

## 2019-04-01 DIAGNOSIS — K7689 Other specified diseases of liver: Secondary | ICD-10-CM | POA: Diagnosis not present

## 2019-04-01 DIAGNOSIS — R635 Abnormal weight gain: Secondary | ICD-10-CM | POA: Diagnosis not present

## 2019-04-01 DIAGNOSIS — K740 Hepatic fibrosis: Secondary | ICD-10-CM | POA: Diagnosis not present

## 2019-04-01 DIAGNOSIS — R05 Cough: Secondary | ICD-10-CM | POA: Diagnosis not present

## 2019-04-01 DIAGNOSIS — I81 Portal vein thrombosis: Secondary | ICD-10-CM | POA: Diagnosis not present

## 2019-04-01 DIAGNOSIS — R188 Other ascites: Secondary | ICD-10-CM | POA: Diagnosis not present

## 2019-04-01 DIAGNOSIS — J90 Pleural effusion, not elsewhere classified: Secondary | ICD-10-CM | POA: Diagnosis not present

## 2019-04-01 DIAGNOSIS — K7581 Nonalcoholic steatohepatitis (NASH): Secondary | ICD-10-CM | POA: Diagnosis not present

## 2019-04-02 DIAGNOSIS — R188 Other ascites: Secondary | ICD-10-CM | POA: Diagnosis not present

## 2019-04-02 DIAGNOSIS — R0602 Shortness of breath: Secondary | ICD-10-CM | POA: Diagnosis not present

## 2019-04-02 DIAGNOSIS — K7581 Nonalcoholic steatohepatitis (NASH): Secondary | ICD-10-CM | POA: Diagnosis not present

## 2019-04-02 DIAGNOSIS — K760 Fatty (change of) liver, not elsewhere classified: Secondary | ICD-10-CM | POA: Diagnosis not present

## 2019-04-02 DIAGNOSIS — R05 Cough: Secondary | ICD-10-CM | POA: Diagnosis not present

## 2019-04-02 DIAGNOSIS — I81 Portal vein thrombosis: Secondary | ICD-10-CM | POA: Diagnosis not present

## 2019-04-03 DIAGNOSIS — R0602 Shortness of breath: Secondary | ICD-10-CM | POA: Diagnosis not present

## 2019-04-03 DIAGNOSIS — I81 Portal vein thrombosis: Secondary | ICD-10-CM | POA: Diagnosis not present

## 2019-04-03 DIAGNOSIS — K7581 Nonalcoholic steatohepatitis (NASH): Secondary | ICD-10-CM | POA: Diagnosis not present

## 2019-04-03 DIAGNOSIS — J90 Pleural effusion, not elsewhere classified: Secondary | ICD-10-CM | POA: Diagnosis not present

## 2019-04-03 DIAGNOSIS — R188 Other ascites: Secondary | ICD-10-CM | POA: Diagnosis not present

## 2019-04-03 DIAGNOSIS — R05 Cough: Secondary | ICD-10-CM | POA: Diagnosis not present

## 2019-04-03 DIAGNOSIS — K760 Fatty (change of) liver, not elsewhere classified: Secondary | ICD-10-CM | POA: Diagnosis not present

## 2019-04-04 DIAGNOSIS — R188 Other ascites: Secondary | ICD-10-CM | POA: Diagnosis not present

## 2019-04-04 DIAGNOSIS — R0602 Shortness of breath: Secondary | ICD-10-CM | POA: Diagnosis not present

## 2019-04-04 DIAGNOSIS — I81 Portal vein thrombosis: Secondary | ICD-10-CM | POA: Diagnosis not present

## 2019-04-04 DIAGNOSIS — K760 Fatty (change of) liver, not elsewhere classified: Secondary | ICD-10-CM | POA: Diagnosis not present

## 2019-04-04 DIAGNOSIS — K7581 Nonalcoholic steatohepatitis (NASH): Secondary | ICD-10-CM | POA: Diagnosis not present

## 2019-04-04 DIAGNOSIS — R05 Cough: Secondary | ICD-10-CM | POA: Diagnosis not present

## 2019-04-07 DIAGNOSIS — R188 Other ascites: Secondary | ICD-10-CM | POA: Diagnosis not present

## 2019-04-07 DIAGNOSIS — R0602 Shortness of breath: Secondary | ICD-10-CM | POA: Diagnosis not present

## 2019-04-07 DIAGNOSIS — K7460 Unspecified cirrhosis of liver: Secondary | ICD-10-CM | POA: Diagnosis not present

## 2019-04-12 DIAGNOSIS — K740 Hepatic fibrosis: Secondary | ICD-10-CM | POA: Diagnosis not present

## 2019-04-12 DIAGNOSIS — K7290 Hepatic failure, unspecified without coma: Secondary | ICD-10-CM | POA: Diagnosis not present

## 2019-04-12 DIAGNOSIS — R188 Other ascites: Secondary | ICD-10-CM | POA: Diagnosis not present

## 2019-04-18 DIAGNOSIS — G4733 Obstructive sleep apnea (adult) (pediatric): Secondary | ICD-10-CM | POA: Diagnosis not present

## 2019-04-21 DIAGNOSIS — R188 Other ascites: Secondary | ICD-10-CM | POA: Diagnosis not present

## 2019-04-26 DIAGNOSIS — K7581 Nonalcoholic steatohepatitis (NASH): Secondary | ICD-10-CM | POA: Diagnosis not present

## 2019-04-26 DIAGNOSIS — E039 Hypothyroidism, unspecified: Secondary | ICD-10-CM | POA: Diagnosis not present

## 2019-04-26 DIAGNOSIS — Z794 Long term (current) use of insulin: Secondary | ICD-10-CM | POA: Diagnosis not present

## 2019-04-26 DIAGNOSIS — E1165 Type 2 diabetes mellitus with hyperglycemia: Secondary | ICD-10-CM | POA: Diagnosis not present

## 2019-04-27 DIAGNOSIS — R188 Other ascites: Secondary | ICD-10-CM | POA: Diagnosis not present

## 2019-05-03 DIAGNOSIS — Z01818 Encounter for other preprocedural examination: Secondary | ICD-10-CM | POA: Diagnosis not present

## 2019-05-04 DIAGNOSIS — Z01818 Encounter for other preprocedural examination: Secondary | ICD-10-CM | POA: Diagnosis not present

## 2019-05-04 DIAGNOSIS — R161 Splenomegaly, not elsewhere classified: Secondary | ICD-10-CM | POA: Diagnosis not present

## 2019-05-04 DIAGNOSIS — K7469 Other cirrhosis of liver: Secondary | ICD-10-CM | POA: Diagnosis not present

## 2019-05-04 DIAGNOSIS — K7290 Hepatic failure, unspecified without coma: Secondary | ICD-10-CM | POA: Diagnosis not present

## 2019-05-04 DIAGNOSIS — M810 Age-related osteoporosis without current pathological fracture: Secondary | ICD-10-CM | POA: Diagnosis not present

## 2019-05-05 DIAGNOSIS — E039 Hypothyroidism, unspecified: Secondary | ICD-10-CM | POA: Diagnosis not present

## 2019-05-05 DIAGNOSIS — I8500 Esophageal varices without bleeding: Secondary | ICD-10-CM | POA: Diagnosis not present

## 2019-05-05 DIAGNOSIS — I10 Essential (primary) hypertension: Secondary | ICD-10-CM | POA: Diagnosis not present

## 2019-05-05 DIAGNOSIS — M1710 Unilateral primary osteoarthritis, unspecified knee: Secondary | ICD-10-CM | POA: Diagnosis not present

## 2019-05-05 DIAGNOSIS — G4730 Sleep apnea, unspecified: Secondary | ICD-10-CM | POA: Diagnosis not present

## 2019-05-05 DIAGNOSIS — Z79899 Other long term (current) drug therapy: Secondary | ICD-10-CM | POA: Diagnosis not present

## 2019-05-05 DIAGNOSIS — E785 Hyperlipidemia, unspecified: Secondary | ICD-10-CM | POA: Diagnosis not present

## 2019-05-05 DIAGNOSIS — Z6830 Body mass index (BMI) 30.0-30.9, adult: Secondary | ICD-10-CM | POA: Diagnosis not present

## 2019-05-05 DIAGNOSIS — Z6828 Body mass index (BMI) 28.0-28.9, adult: Secondary | ICD-10-CM | POA: Diagnosis not present

## 2019-05-05 DIAGNOSIS — K581 Irritable bowel syndrome with constipation: Secondary | ICD-10-CM | POA: Diagnosis not present

## 2019-05-05 DIAGNOSIS — E119 Type 2 diabetes mellitus without complications: Secondary | ICD-10-CM | POA: Diagnosis not present

## 2019-05-05 DIAGNOSIS — K7290 Hepatic failure, unspecified without coma: Secondary | ICD-10-CM | POA: Diagnosis not present

## 2019-05-05 DIAGNOSIS — Z01818 Encounter for other preprocedural examination: Secondary | ICD-10-CM | POA: Diagnosis not present

## 2019-05-05 DIAGNOSIS — Z794 Long term (current) use of insulin: Secondary | ICD-10-CM | POA: Diagnosis not present

## 2019-05-05 DIAGNOSIS — R188 Other ascites: Secondary | ICD-10-CM | POA: Diagnosis not present

## 2019-05-05 DIAGNOSIS — E669 Obesity, unspecified: Secondary | ICD-10-CM | POA: Diagnosis not present

## 2019-05-06 DIAGNOSIS — Z01818 Encounter for other preprocedural examination: Secondary | ICD-10-CM | POA: Diagnosis not present

## 2019-05-06 DIAGNOSIS — Z01810 Encounter for preprocedural cardiovascular examination: Secondary | ICD-10-CM | POA: Diagnosis not present

## 2019-05-06 DIAGNOSIS — K7290 Hepatic failure, unspecified without coma: Secondary | ICD-10-CM | POA: Diagnosis not present

## 2019-05-09 DIAGNOSIS — K760 Fatty (change of) liver, not elsewhere classified: Secondary | ICD-10-CM | POA: Diagnosis not present

## 2019-05-09 DIAGNOSIS — Z01818 Encounter for other preprocedural examination: Secondary | ICD-10-CM | POA: Diagnosis not present

## 2019-05-16 DIAGNOSIS — Z1159 Encounter for screening for other viral diseases: Secondary | ICD-10-CM | POA: Diagnosis not present

## 2019-05-16 DIAGNOSIS — K7290 Hepatic failure, unspecified without coma: Secondary | ICD-10-CM | POA: Diagnosis not present

## 2019-05-17 DIAGNOSIS — Z1211 Encounter for screening for malignant neoplasm of colon: Secondary | ICD-10-CM | POA: Diagnosis not present

## 2019-05-17 DIAGNOSIS — I10 Essential (primary) hypertension: Secondary | ICD-10-CM | POA: Diagnosis not present

## 2019-05-17 DIAGNOSIS — E669 Obesity, unspecified: Secondary | ICD-10-CM | POA: Diagnosis not present

## 2019-05-17 DIAGNOSIS — E785 Hyperlipidemia, unspecified: Secondary | ICD-10-CM | POA: Diagnosis not present

## 2019-05-17 DIAGNOSIS — K3189 Other diseases of stomach and duodenum: Secondary | ICD-10-CM | POA: Diagnosis not present

## 2019-05-17 DIAGNOSIS — K7581 Nonalcoholic steatohepatitis (NASH): Secondary | ICD-10-CM | POA: Diagnosis not present

## 2019-05-17 DIAGNOSIS — K635 Polyp of colon: Secondary | ICD-10-CM | POA: Diagnosis not present

## 2019-05-17 DIAGNOSIS — Z794 Long term (current) use of insulin: Secondary | ICD-10-CM | POA: Diagnosis not present

## 2019-05-17 DIAGNOSIS — K7290 Hepatic failure, unspecified without coma: Secondary | ICD-10-CM | POA: Diagnosis not present

## 2019-05-17 DIAGNOSIS — M1710 Unilateral primary osteoarthritis, unspecified knee: Secondary | ICD-10-CM | POA: Diagnosis not present

## 2019-05-17 DIAGNOSIS — K589 Irritable bowel syndrome without diarrhea: Secondary | ICD-10-CM | POA: Diagnosis not present

## 2019-05-17 DIAGNOSIS — E039 Hypothyroidism, unspecified: Secondary | ICD-10-CM | POA: Diagnosis not present

## 2019-05-17 DIAGNOSIS — E119 Type 2 diabetes mellitus without complications: Secondary | ICD-10-CM | POA: Diagnosis not present

## 2019-05-17 DIAGNOSIS — I8500 Esophageal varices without bleeding: Secondary | ICD-10-CM | POA: Diagnosis not present

## 2019-05-17 DIAGNOSIS — G4730 Sleep apnea, unspecified: Secondary | ICD-10-CM | POA: Diagnosis not present

## 2019-05-17 DIAGNOSIS — K766 Portal hypertension: Secondary | ICD-10-CM | POA: Diagnosis not present

## 2019-05-17 DIAGNOSIS — D123 Benign neoplasm of transverse colon: Secondary | ICD-10-CM | POA: Diagnosis not present

## 2019-05-23 DIAGNOSIS — K7460 Unspecified cirrhosis of liver: Secondary | ICD-10-CM | POA: Diagnosis not present

## 2019-05-23 DIAGNOSIS — R188 Other ascites: Secondary | ICD-10-CM | POA: Diagnosis not present

## 2019-05-26 DIAGNOSIS — E039 Hypothyroidism, unspecified: Secondary | ICD-10-CM | POA: Diagnosis not present

## 2019-05-26 DIAGNOSIS — I10 Essential (primary) hypertension: Secondary | ICD-10-CM | POA: Diagnosis not present

## 2019-05-26 DIAGNOSIS — D696 Thrombocytopenia, unspecified: Secondary | ICD-10-CM | POA: Diagnosis not present

## 2019-05-26 DIAGNOSIS — E113291 Type 2 diabetes mellitus with mild nonproliferative diabetic retinopathy without macular edema, right eye: Secondary | ICD-10-CM | POA: Diagnosis not present

## 2019-06-02 DIAGNOSIS — E1165 Type 2 diabetes mellitus with hyperglycemia: Secondary | ICD-10-CM | POA: Diagnosis not present

## 2019-06-02 DIAGNOSIS — D696 Thrombocytopenia, unspecified: Secondary | ICD-10-CM | POA: Diagnosis not present

## 2019-06-02 DIAGNOSIS — E039 Hypothyroidism, unspecified: Secondary | ICD-10-CM | POA: Diagnosis not present

## 2019-06-02 DIAGNOSIS — Z0000 Encounter for general adult medical examination without abnormal findings: Secondary | ICD-10-CM | POA: Diagnosis not present

## 2019-06-02 DIAGNOSIS — K7581 Nonalcoholic steatohepatitis (NASH): Secondary | ICD-10-CM | POA: Diagnosis not present

## 2019-06-14 DIAGNOSIS — K7460 Unspecified cirrhosis of liver: Secondary | ICD-10-CM | POA: Diagnosis not present

## 2019-06-14 DIAGNOSIS — R112 Nausea with vomiting, unspecified: Secondary | ICD-10-CM | POA: Diagnosis not present

## 2019-06-14 DIAGNOSIS — R4182 Altered mental status, unspecified: Secondary | ICD-10-CM | POA: Diagnosis not present

## 2019-06-14 DIAGNOSIS — I10 Essential (primary) hypertension: Secondary | ICD-10-CM | POA: Diagnosis not present

## 2019-06-14 DIAGNOSIS — E119 Type 2 diabetes mellitus without complications: Secondary | ICD-10-CM | POA: Diagnosis not present

## 2019-06-14 DIAGNOSIS — K7469 Other cirrhosis of liver: Secondary | ICD-10-CM | POA: Diagnosis not present

## 2019-06-14 DIAGNOSIS — Z20828 Contact with and (suspected) exposure to other viral communicable diseases: Secondary | ICD-10-CM | POA: Diagnosis not present

## 2019-06-14 DIAGNOSIS — K7581 Nonalcoholic steatohepatitis (NASH): Secondary | ICD-10-CM | POA: Diagnosis not present

## 2019-06-14 DIAGNOSIS — E039 Hypothyroidism, unspecified: Secondary | ICD-10-CM | POA: Diagnosis not present

## 2019-06-14 DIAGNOSIS — K760 Fatty (change of) liver, not elsewhere classified: Secondary | ICD-10-CM | POA: Diagnosis not present

## 2019-06-14 DIAGNOSIS — E785 Hyperlipidemia, unspecified: Secondary | ICD-10-CM | POA: Diagnosis not present

## 2019-06-14 DIAGNOSIS — N3000 Acute cystitis without hematuria: Secondary | ICD-10-CM | POA: Diagnosis not present

## 2019-06-14 DIAGNOSIS — K7290 Hepatic failure, unspecified without coma: Secondary | ICD-10-CM | POA: Diagnosis not present

## 2019-06-14 DIAGNOSIS — K769 Liver disease, unspecified: Secondary | ICD-10-CM | POA: Diagnosis not present

## 2019-06-14 DIAGNOSIS — R188 Other ascites: Secondary | ICD-10-CM | POA: Diagnosis not present

## 2019-06-15 DIAGNOSIS — R4182 Altered mental status, unspecified: Secondary | ICD-10-CM | POA: Diagnosis not present

## 2019-06-15 DIAGNOSIS — K7469 Other cirrhosis of liver: Secondary | ICD-10-CM | POA: Diagnosis not present

## 2019-06-15 DIAGNOSIS — N3000 Acute cystitis without hematuria: Secondary | ICD-10-CM | POA: Diagnosis not present

## 2019-06-15 DIAGNOSIS — R188 Other ascites: Secondary | ICD-10-CM | POA: Diagnosis not present

## 2019-06-15 DIAGNOSIS — K760 Fatty (change of) liver, not elsewhere classified: Secondary | ICD-10-CM | POA: Diagnosis not present

## 2019-06-15 DIAGNOSIS — K769 Liver disease, unspecified: Secondary | ICD-10-CM | POA: Diagnosis not present

## 2019-06-15 DIAGNOSIS — K7290 Hepatic failure, unspecified without coma: Secondary | ICD-10-CM | POA: Diagnosis not present

## 2019-06-15 DIAGNOSIS — K7581 Nonalcoholic steatohepatitis (NASH): Secondary | ICD-10-CM | POA: Diagnosis not present

## 2019-06-16 DIAGNOSIS — G4733 Obstructive sleep apnea (adult) (pediatric): Secondary | ICD-10-CM | POA: Diagnosis not present

## 2019-06-16 DIAGNOSIS — Z6828 Body mass index (BMI) 28.0-28.9, adult: Secondary | ICD-10-CM | POA: Diagnosis not present

## 2019-06-16 DIAGNOSIS — K7581 Nonalcoholic steatohepatitis (NASH): Secondary | ICD-10-CM | POA: Diagnosis not present

## 2019-06-28 DIAGNOSIS — Z1231 Encounter for screening mammogram for malignant neoplasm of breast: Secondary | ICD-10-CM | POA: Diagnosis not present

## 2019-07-04 DIAGNOSIS — K7290 Hepatic failure, unspecified without coma: Secondary | ICD-10-CM | POA: Diagnosis not present

## 2019-07-11 DIAGNOSIS — R928 Other abnormal and inconclusive findings on diagnostic imaging of breast: Secondary | ICD-10-CM | POA: Diagnosis not present

## 2019-08-01 DIAGNOSIS — K766 Portal hypertension: Secondary | ICD-10-CM | POA: Diagnosis not present

## 2019-08-01 DIAGNOSIS — R161 Splenomegaly, not elsewhere classified: Secondary | ICD-10-CM | POA: Diagnosis not present

## 2019-08-01 DIAGNOSIS — I864 Gastric varices: Secondary | ICD-10-CM | POA: Diagnosis not present

## 2019-08-01 DIAGNOSIS — K7290 Hepatic failure, unspecified without coma: Secondary | ICD-10-CM | POA: Diagnosis not present

## 2019-08-01 DIAGNOSIS — K7460 Unspecified cirrhosis of liver: Secondary | ICD-10-CM | POA: Diagnosis not present

## 2019-09-22 DIAGNOSIS — G4733 Obstructive sleep apnea (adult) (pediatric): Secondary | ICD-10-CM | POA: Diagnosis not present

## 2019-09-22 DIAGNOSIS — K7290 Hepatic failure, unspecified without coma: Secondary | ICD-10-CM | POA: Diagnosis not present

## 2019-09-22 DIAGNOSIS — M5137 Other intervertebral disc degeneration, lumbosacral region: Secondary | ICD-10-CM | POA: Diagnosis not present

## 2019-09-22 DIAGNOSIS — I10 Essential (primary) hypertension: Secondary | ICD-10-CM | POA: Diagnosis not present

## 2019-09-22 DIAGNOSIS — M47816 Spondylosis without myelopathy or radiculopathy, lumbar region: Secondary | ICD-10-CM | POA: Diagnosis not present

## 2019-09-22 DIAGNOSIS — M545 Low back pain: Secondary | ICD-10-CM | POA: Diagnosis not present

## 2020-02-11 IMAGING — CR DG CHEST 2V
2 series · 2 of 2 positions shown · non-contrast
Comparison: None.

CLINICAL DATA: Chest pain, shortness of breath.

EXAM:
CHEST - 2 VIEW

[chest pa]
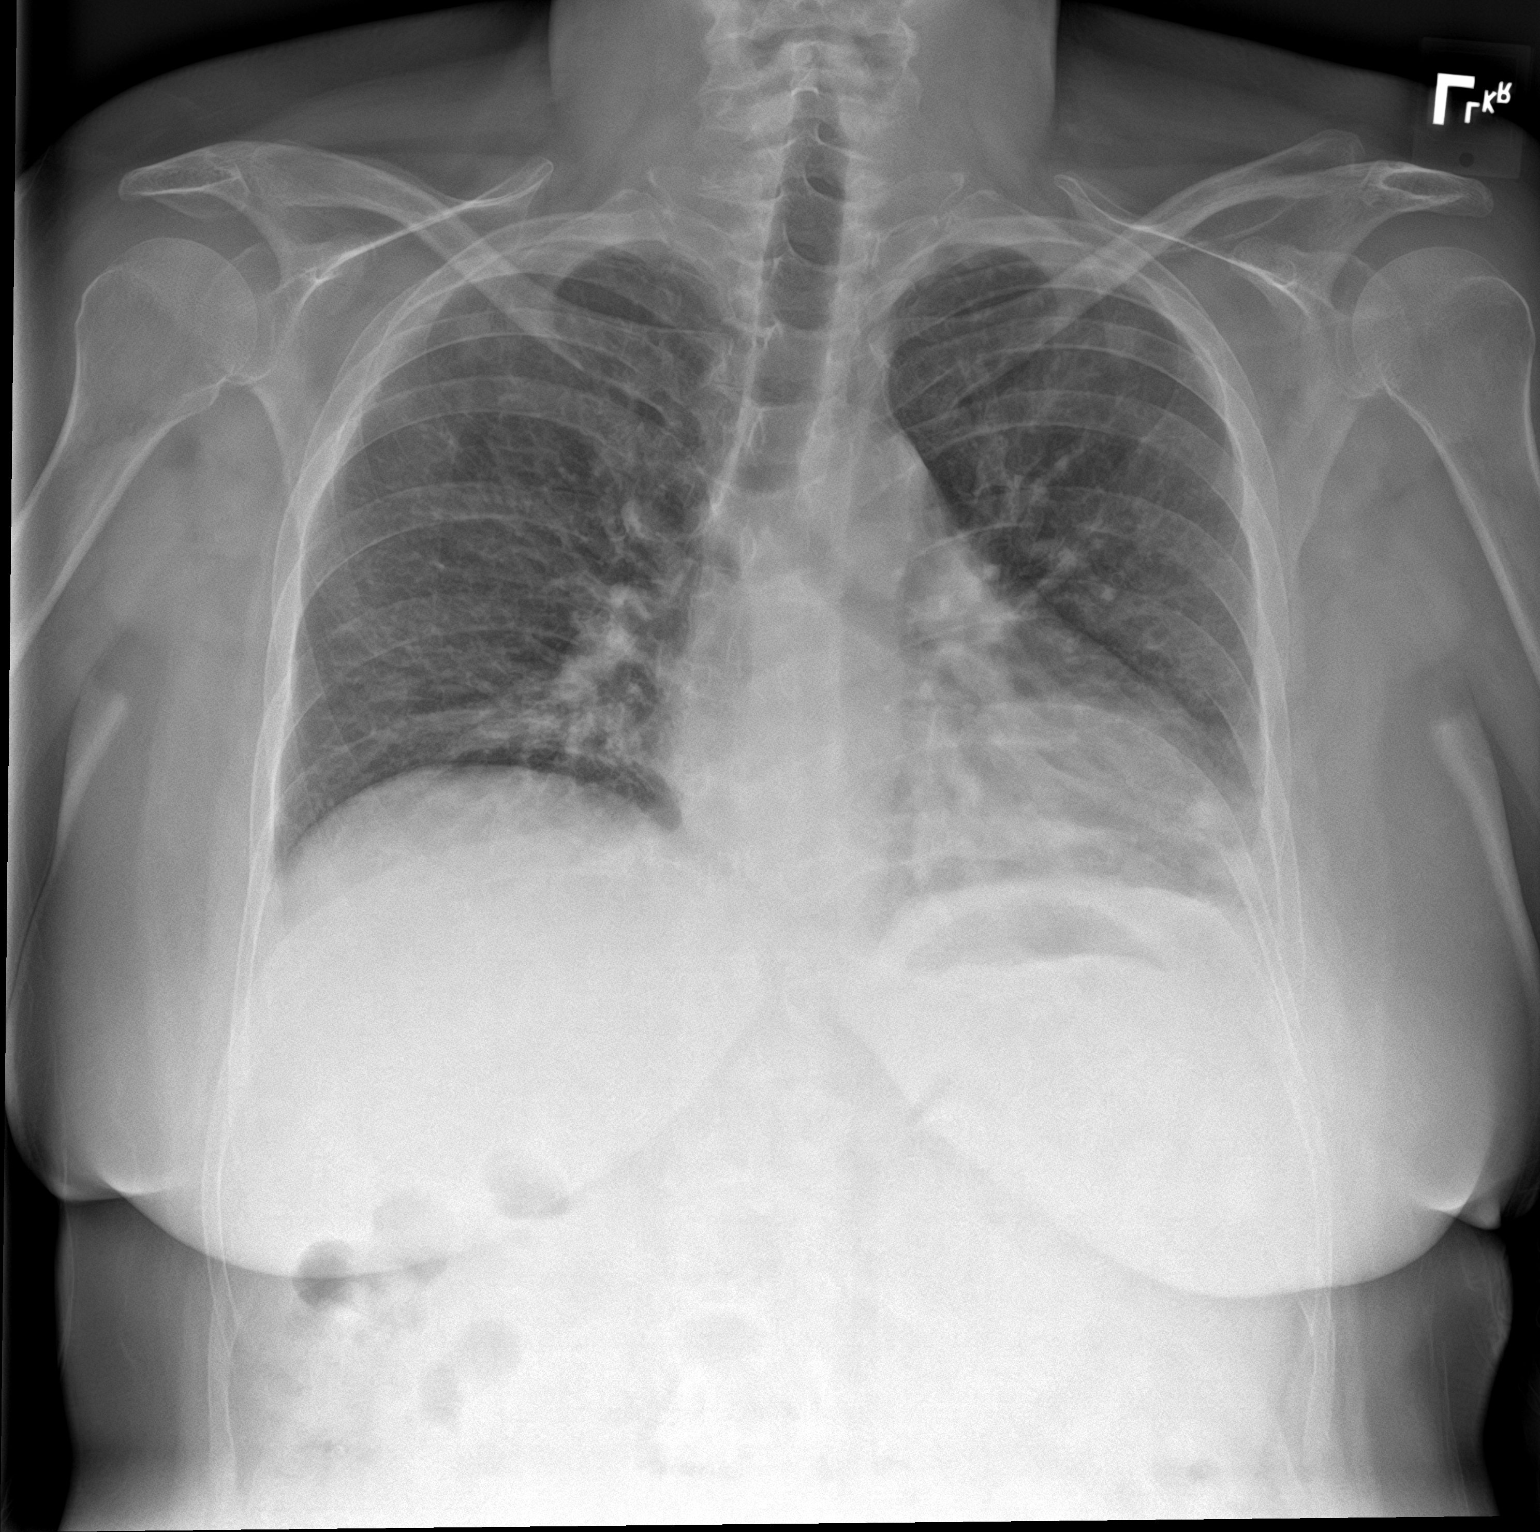

[chest lat]
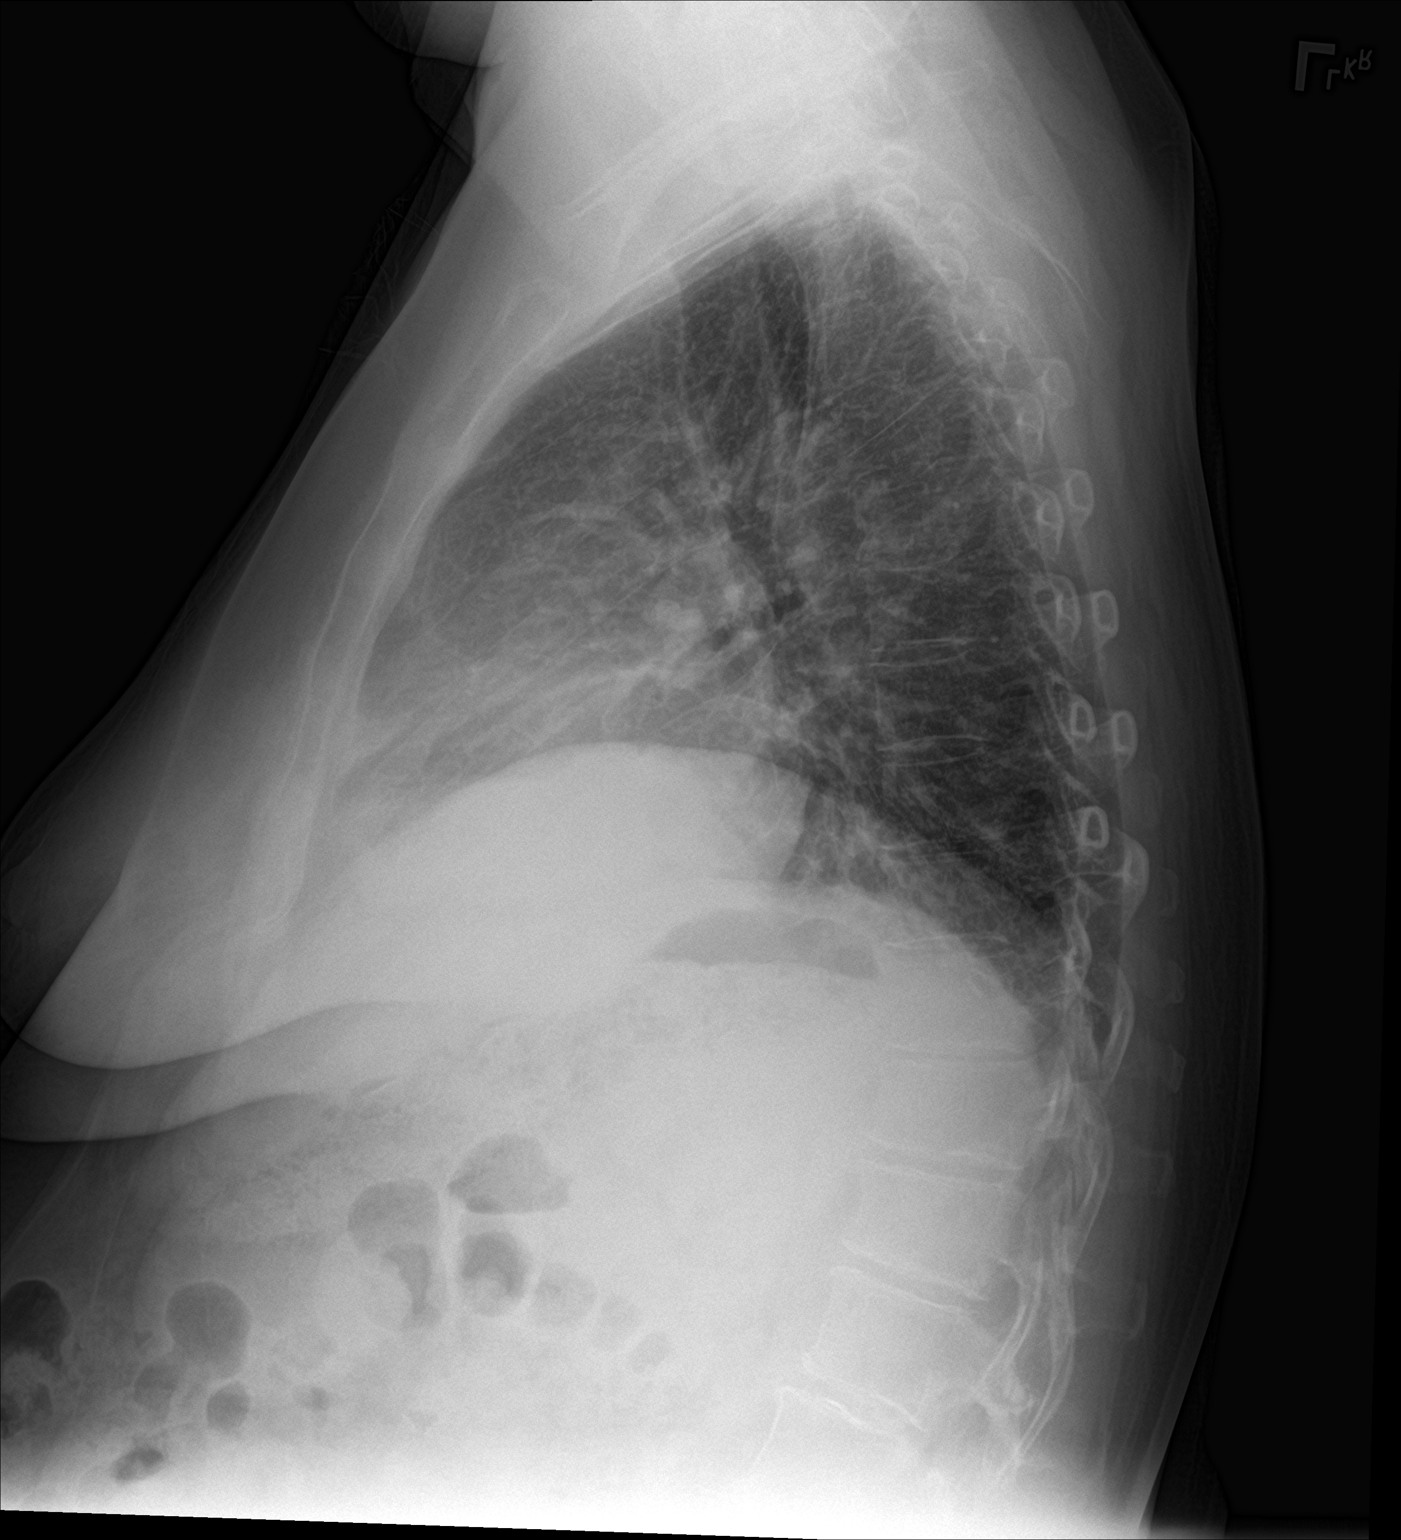

[2 of 2 positions shown; findings below may reference images not displayed]

FINDINGS: The heart size and mediastinal contours are within normal limits. No
pneumothorax or pleural effusion is noted. Hypoinflation of the
lungs is noted. Right lung is clear. Mild left basilar atelectasis
or infiltrate is noted. The visualized skeletal structures are
unremarkable.
IMPRESSION: Hypoinflation of the lungs. Mild left basilar subsegmental
atelectasis or pneumonia is noted. Followup PA and lateral chest
X-ray is recommended in 3-4 weeks following trial of antibiotic
therapy to ensure resolution and exclude underlying malignancy.

## 2020-05-15 ENCOUNTER — Telehealth: Payer: Self-pay | Admitting: Internal Medicine

## 2020-05-15 NOTE — Telephone Encounter (Signed)
Copied from [place] [id number]. Topic: Complaint - Billing/Coding >> May 15, 2020 11:03 AM Percell Belt A wrote: DOS: 06/27/2019 Details of complaint: pt called in and stated that she has already called billing and they told her they could not do anything for her.  They told her to call the office , she was billed for medical records that she stated she never requested.  The Date of service on the bill showed 06/27/2019 Act number [id number] How would the patient like to see this issue resolved? She would like this waived  Best number [phone]   Route to Engineer, building services.
